# Patient Record
Sex: Female | Born: 1955 | Hispanic: Refuse to answer | Marital: Married | Smoking: Former smoker
Health system: Southern US, Community
[De-identification: ages and names within clinical notes are randomized; demographics above are authoritative.]

## PROBLEM LIST (undated history)

## (undated) DIAGNOSIS — E785 Hyperlipidemia, unspecified: Secondary | ICD-10-CM

## (undated) DIAGNOSIS — I10 Essential (primary) hypertension: Secondary | ICD-10-CM

## (undated) DIAGNOSIS — E119 Type 2 diabetes mellitus without complications: Secondary | ICD-10-CM

## (undated) HISTORY — DX: Hyperlipidemia, unspecified: E78.5

## (undated) HISTORY — DX: Type 2 diabetes mellitus without complications: E11.9

## (undated) HISTORY — PX: CARDIAC CATHETERIZATION: SHX172

## (undated) HISTORY — DX: Essential (primary) hypertension: I10

---

## 2016-03-27 DIAGNOSIS — M19072 Primary osteoarthritis, left ankle and foot: Secondary | ICD-10-CM | POA: Diagnosis not present

## 2016-03-27 DIAGNOSIS — M7732 Calcaneal spur, left foot: Secondary | ICD-10-CM | POA: Diagnosis not present

## 2016-03-27 DIAGNOSIS — M79672 Pain in left foot: Secondary | ICD-10-CM | POA: Diagnosis not present

## 2016-03-29 DIAGNOSIS — Z0000 Encounter for general adult medical examination without abnormal findings: Secondary | ICD-10-CM | POA: Diagnosis not present

## 2016-03-29 DIAGNOSIS — M8580 Other specified disorders of bone density and structure, unspecified site: Secondary | ICD-10-CM | POA: Diagnosis not present

## 2016-03-29 DIAGNOSIS — E785 Hyperlipidemia, unspecified: Secondary | ICD-10-CM | POA: Diagnosis not present

## 2016-03-29 DIAGNOSIS — E1169 Type 2 diabetes mellitus with other specified complication: Secondary | ICD-10-CM | POA: Diagnosis not present

## 2016-03-29 DIAGNOSIS — Z794 Long term (current) use of insulin: Secondary | ICD-10-CM | POA: Diagnosis not present

## 2016-04-03 DIAGNOSIS — E785 Hyperlipidemia, unspecified: Secondary | ICD-10-CM | POA: Diagnosis not present

## 2016-04-03 DIAGNOSIS — Z1239 Encounter for other screening for malignant neoplasm of breast: Secondary | ICD-10-CM | POA: Diagnosis not present

## 2016-04-03 DIAGNOSIS — R748 Abnormal levels of other serum enzymes: Secondary | ICD-10-CM | POA: Diagnosis not present

## 2016-04-03 DIAGNOSIS — M8580 Other specified disorders of bone density and structure, unspecified site: Secondary | ICD-10-CM | POA: Diagnosis not present

## 2016-04-03 DIAGNOSIS — Z0000 Encounter for general adult medical examination without abnormal findings: Secondary | ICD-10-CM | POA: Diagnosis not present

## 2016-09-06 DIAGNOSIS — E1140 Type 2 diabetes mellitus with diabetic neuropathy, unspecified: Secondary | ICD-10-CM | POA: Diagnosis not present

## 2016-09-06 DIAGNOSIS — E781 Pure hyperglyceridemia: Secondary | ICD-10-CM | POA: Diagnosis not present

## 2016-09-06 DIAGNOSIS — Z794 Long term (current) use of insulin: Secondary | ICD-10-CM | POA: Diagnosis not present

## 2016-09-10 DIAGNOSIS — N3001 Acute cystitis with hematuria: Secondary | ICD-10-CM | POA: Diagnosis not present

## 2016-12-06 DIAGNOSIS — M8580 Other specified disorders of bone density and structure, unspecified site: Secondary | ICD-10-CM | POA: Diagnosis not present

## 2016-12-06 DIAGNOSIS — E1140 Type 2 diabetes mellitus with diabetic neuropathy, unspecified: Secondary | ICD-10-CM | POA: Diagnosis not present

## 2016-12-06 DIAGNOSIS — E785 Hyperlipidemia, unspecified: Secondary | ICD-10-CM | POA: Diagnosis not present

## 2016-12-06 DIAGNOSIS — R8290 Unspecified abnormal findings in urine: Secondary | ICD-10-CM | POA: Diagnosis not present

## 2016-12-06 DIAGNOSIS — Z794 Long term (current) use of insulin: Secondary | ICD-10-CM | POA: Diagnosis not present

## 2016-12-06 DIAGNOSIS — E781 Pure hyperglyceridemia: Secondary | ICD-10-CM | POA: Diagnosis not present

## 2016-12-06 DIAGNOSIS — E559 Vitamin D deficiency, unspecified: Secondary | ICD-10-CM | POA: Diagnosis not present

## 2016-12-06 DIAGNOSIS — R300 Dysuria: Secondary | ICD-10-CM | POA: Diagnosis not present

## 2017-03-19 DIAGNOSIS — R1031 Right lower quadrant pain: Secondary | ICD-10-CM | POA: Diagnosis not present

## 2017-04-04 DIAGNOSIS — E1140 Type 2 diabetes mellitus with diabetic neuropathy, unspecified: Secondary | ICD-10-CM | POA: Diagnosis not present

## 2017-04-04 DIAGNOSIS — E785 Hyperlipidemia, unspecified: Secondary | ICD-10-CM | POA: Diagnosis not present

## 2017-04-04 DIAGNOSIS — Z794 Long term (current) use of insulin: Secondary | ICD-10-CM | POA: Diagnosis not present

## 2017-04-04 DIAGNOSIS — I10 Essential (primary) hypertension: Secondary | ICD-10-CM | POA: Diagnosis not present

## 2017-04-14 DIAGNOSIS — E1142 Type 2 diabetes mellitus with diabetic polyneuropathy: Secondary | ICD-10-CM | POA: Diagnosis not present

## 2017-04-14 DIAGNOSIS — Z1231 Encounter for screening mammogram for malignant neoplasm of breast: Secondary | ICD-10-CM | POA: Diagnosis not present

## 2017-04-14 DIAGNOSIS — E113292 Type 2 diabetes mellitus with mild nonproliferative diabetic retinopathy without macular edema, left eye: Secondary | ICD-10-CM | POA: Diagnosis not present

## 2017-04-14 DIAGNOSIS — E1169 Type 2 diabetes mellitus with other specified complication: Secondary | ICD-10-CM | POA: Diagnosis not present

## 2017-04-14 DIAGNOSIS — E1140 Type 2 diabetes mellitus with diabetic neuropathy, unspecified: Secondary | ICD-10-CM | POA: Diagnosis not present

## 2017-04-29 DIAGNOSIS — R1031 Right lower quadrant pain: Secondary | ICD-10-CM | POA: Diagnosis not present

## 2017-05-08 DIAGNOSIS — D3502 Benign neoplasm of left adrenal gland: Secondary | ICD-10-CM | POA: Diagnosis not present

## 2017-05-10 DIAGNOSIS — D3502 Benign neoplasm of left adrenal gland: Secondary | ICD-10-CM | POA: Diagnosis not present

## 2017-05-14 DIAGNOSIS — R1031 Right lower quadrant pain: Secondary | ICD-10-CM | POA: Diagnosis not present

## 2017-05-14 DIAGNOSIS — D259 Leiomyoma of uterus, unspecified: Secondary | ICD-10-CM | POA: Diagnosis not present

## 2017-06-02 DIAGNOSIS — D251 Intramural leiomyoma of uterus: Secondary | ICD-10-CM | POA: Diagnosis not present

## 2017-06-06 DIAGNOSIS — I10 Essential (primary) hypertension: Secondary | ICD-10-CM | POA: Diagnosis not present

## 2017-06-09 DIAGNOSIS — I10 Essential (primary) hypertension: Secondary | ICD-10-CM | POA: Diagnosis not present

## 2017-10-06 DIAGNOSIS — I700 Atherosclerosis of aorta: Secondary | ICD-10-CM | POA: Diagnosis not present

## 2017-10-06 DIAGNOSIS — E1169 Type 2 diabetes mellitus with other specified complication: Secondary | ICD-10-CM | POA: Diagnosis not present

## 2017-10-06 DIAGNOSIS — E785 Hyperlipidemia, unspecified: Secondary | ICD-10-CM | POA: Diagnosis not present

## 2017-10-06 DIAGNOSIS — E559 Vitamin D deficiency, unspecified: Secondary | ICD-10-CM | POA: Diagnosis not present

## 2017-10-06 DIAGNOSIS — I10 Essential (primary) hypertension: Secondary | ICD-10-CM | POA: Diagnosis not present

## 2017-10-06 DIAGNOSIS — E1140 Type 2 diabetes mellitus with diabetic neuropathy, unspecified: Secondary | ICD-10-CM | POA: Diagnosis not present

## 2017-10-06 DIAGNOSIS — E1142 Type 2 diabetes mellitus with diabetic polyneuropathy: Secondary | ICD-10-CM | POA: Diagnosis not present

## 2017-10-06 DIAGNOSIS — Z794 Long term (current) use of insulin: Secondary | ICD-10-CM | POA: Diagnosis not present

## 2017-10-10 DIAGNOSIS — R011 Cardiac murmur, unspecified: Secondary | ICD-10-CM | POA: Diagnosis not present

## 2018-01-05 DIAGNOSIS — R011 Cardiac murmur, unspecified: Secondary | ICD-10-CM | POA: Diagnosis not present

## 2018-01-06 DIAGNOSIS — R002 Palpitations: Secondary | ICD-10-CM | POA: Diagnosis not present

## 2018-03-06 ENCOUNTER — Ambulatory Visit (INDEPENDENT_AMBULATORY_CARE_PROVIDER_SITE_OTHER): Payer: BLUE CROSS/BLUE SHIELD | Admitting: Physician Assistant

## 2018-03-06 ENCOUNTER — Encounter: Payer: Self-pay | Admitting: Physician Assistant

## 2018-03-06 ENCOUNTER — Other Ambulatory Visit: Payer: Self-pay

## 2018-03-06 VITALS — BP 130/68 | HR 96 | Temp 98.2°F | Ht 62.0 in | Wt 167.8 lb

## 2018-03-06 DIAGNOSIS — M273 Alveolitis of jaws: Secondary | ICD-10-CM | POA: Diagnosis not present

## 2018-03-06 MED ORDER — AMOXICILLIN-POT CLAVULANATE 875-125 MG PO TABS
1.0000 | ORAL_TABLET | Freq: Two times a day (BID) | ORAL | 0 refills | Status: AC
Start: 2018-03-06 — End: 2018-03-20

## 2018-03-06 MED ORDER — NAPROXEN 500 MG PO TABS: 500.0000 mg | ORAL_TABLET | Freq: Two times a day (BID) | ORAL | 0 refills | Status: DC

## 2018-03-06 NOTE — Patient Instructions (Addendum)
  Take ten days of the augmentin now. Hold the last 8 pills and start them back within 4 days of going to the dentist for your august appointment.    IF you received an x-ray today, you will receive an invoice from [facility]. Please contact [facility] at [phone] with questions or concerns regarding your invoice.   IF you received labwork today, you will receive an invoice from [facility]. Please contact [facility] at [phone] with questions or concerns regarding your invoice.   Our billing staff will not be able to assist you with questions regarding bills from these companies.  You will be contacted with the lab results as soon as they are available. The fastest way to get your results is to activate your My Chart account. Instructions are located on the last page of this paperwork. If you have not heard from us regarding the results in 2 weeks, please contact this office.

## 2018-03-06 NOTE — Progress Notes (Signed)
    03/09/2018 8:56 AM   DOB: 04-02-1956 / MRN: [id number]  SUBJECTIVE:  Olivia Garza is a 62 y.o. female presenting for tooth pain that started five days ago and is worsening. She has tried several OTC therapies without relief. She has a broken tooth about the area that is paining her and tells me that she has a dental appointment scheduled.   She has no allergies on file.   She  has a past medical history of Diabetes mellitus without complication (HCC).    She  reports that she has quit smoking. She has never used smokeless tobacco. She reports that she drinks about 1.8 oz of alcohol per week. She reports that she does not use drugs. She  has no sexual activity history on file. The patient  has no past surgical history on file.  Her family history includes Cancer in her father and mother; Diabetes in her father; Heart disease in her father.  Review of Systems  Constitutional: Negative for chills and fever.  HENT: Negative for sore throat.   Respiratory: Negative for cough.   Cardiovascular: Negative for chest pain.  Skin: Negative for itching and rash.  Neurological: Negative for dizziness.    The problem list and medications were reviewed and updated by myself where necessary and exist elsewhere in the encounter.   OBJECTIVE:  BP 130/68 (BP Location: Left Arm, Patient Position: Sitting, Cuff Size: Normal)   Pulse 96   Temp 98.2 F (36.8 C) (Oral)   Ht 5\' 2"  (1.575 m)   Wt 167 lb 12.8 oz (76.1 kg)   SpO2 96%   BMI 30.69 kg/m   Wt Readings from Last 3 Encounters:  03/06/18 167 lb 12.8 oz (76.1 kg)   Temp Readings from Last 3 Encounters:  03/06/18 98.2 F (36.8 C) (Oral)   BP Readings from Last 3 Encounters:  03/06/18 130/68   Pulse Readings from Last 3 Encounters:  03/06/18 96    Physical Exam  Constitutional: She is oriented to person, place, and time. She appears well-nourished. No distress.  HENT:  Mouth/Throat:    Eyes: Pupils are equal, round, and  reactive to light. EOM are normal.  Cardiovascular: Normal rate.  Pulmonary/Chest: Effort normal.  Abdominal: She exhibits no distension.  Neurological: She is alert and oriented to person, place, and time. No cranial nerve deficit. Gait normal.  Skin: Skin is dry. She is not diaphoretic.  Psychiatric: She has a normal mood and affect.  Vitals reviewed.     ASSESSMENT AND PLAN:  Olivia Garza was seen today for tooth.  Diagnoses and all orders for this visit:  Infection of tooth socket -     amoxicillin-clavulanate (AUGMENTIN) 875-125 MG tablet; Take 1 tablet by mouth 2 (two) times daily for 14 days. Take with food. -     naproxen (NAPROSYN) 500 MG tablet; Take 1 tablet (500 mg total) by mouth 2 (two) times daily with a meal.    The patient is advised to call or return to clinic if she does not see an improvement in symptoms, or to seek the care of the closest emergency department if she worsens with the above plan.   Deliah BostonMichael Clark, MHS, PA-C Primary Care at [facility] 03/09/2018 8:56 AM

## 2018-03-18 ENCOUNTER — Ambulatory Visit (INDEPENDENT_AMBULATORY_CARE_PROVIDER_SITE_OTHER): Payer: BLUE CROSS/BLUE SHIELD | Admitting: Physician Assistant

## 2018-03-18 ENCOUNTER — Ambulatory Visit: Payer: Self-pay | Admitting: *Deleted

## 2018-03-18 ENCOUNTER — Other Ambulatory Visit: Payer: Self-pay

## 2018-03-18 ENCOUNTER — Encounter: Payer: Self-pay | Admitting: Physician Assistant

## 2018-03-18 VITALS — BP 128/68 | HR 90 | Temp 98.7°F | Resp 16 | Ht 62.0 in | Wt 168.6 lb

## 2018-03-18 DIAGNOSIS — M273 Alveolitis of jaws: Secondary | ICD-10-CM

## 2018-03-18 MED ORDER — TRAMADOL HCL 50 MG PO TABS: 50.0000 mg | ORAL_TABLET | Freq: Three times a day (TID) | ORAL | 0 refills | Status: DC | PRN

## 2018-03-18 MED ORDER — CLINDAMYCIN HCL 300 MG PO CAPS: 300.0000 mg | ORAL_CAPSULE | Freq: Three times a day (TID) | ORAL | 0 refills | Status: DC

## 2018-03-18 NOTE — Progress Notes (Signed)
03/18/2018 2:20 PM   DOB: 12-26-1955 / MRN: [id number]  SUBJECTIVE:  Olivia Garza is a 62 y.o. female presenting for recurrence of tooth pain. Symptoms present for about 1 month now.  The problem was improved with augmentin and naprosyn however the pain returned after cessation of augmentin. She has an appointment with a dentist the middle of this month.  She has No Known Allergies.   She  has a past medical history of Diabetes mellitus without complication (HCC).    She  reports that she has quit smoking. She has never used smokeless tobacco. She reports that she drinks about 1.8 oz of alcohol per week. She reports that she does not use drugs. She  has no sexual activity history on file. The patient  has no past surgical history on file.  Her family history includes Cancer in her father and mother; Diabetes in her father; Heart disease in her father.  Review of Systems  Constitutional: Negative for chills, diaphoresis and fever.  Eyes: Negative.   Respiratory: Negative for cough, hemoptysis, sputum production, shortness of breath and wheezing.   Cardiovascular: Negative for chest pain, orthopnea and leg swelling.  Gastrointestinal: Negative for abdominal pain, blood in stool, constipation, diarrhea, heartburn, melena, nausea and vomiting.  Genitourinary: Negative for dysuria, flank pain, frequency, hematuria and urgency.  Skin: Negative for rash.  Neurological: Negative for dizziness, sensory change, speech change, focal weakness and headaches.    The problem list and medications were reviewed and updated by myself where necessary and exist elsewhere in the encounter.   OBJECTIVE:  BP 128/68   Pulse 90   Temp 98.7 F (37.1 C)   Resp 16   Ht 5\' 2"  (1.575 m)   Wt 168 lb 9.6 oz (76.5 kg)   SpO2 95%   BMI 30.84 kg/m   Wt Readings from Last 3 Encounters:  03/18/18 168 lb 9.6 oz (76.5 kg)  03/06/18 167 lb 12.8 oz (76.1 kg)   Temp Readings from Last 3 Encounters:    03/18/18 98.7 F (37.1 C)  03/06/18 98.2 F (36.8 C) (Oral)   BP Readings from Last 3 Encounters:  03/18/18 128/68  03/06/18 130/68   Pulse Readings from Last 3 Encounters:  03/18/18 90  03/06/18 96    Physical Exam  Constitutional: She is oriented to person, place, and time. She appears well-nourished. No distress.  HENT:  Mouth/Throat:    Eyes: Pupils are equal, round, and reactive to light. EOM are normal.  Cardiovascular: Normal rate, regular rhythm, S1 normal, S2 normal, normal heart sounds and intact distal pulses. Exam reveals no gallop, no friction rub and no decreased pulses.  No murmur heard. Pulmonary/Chest: Effort normal. No stridor. No respiratory distress. She has no wheezes. She has no rales.  Abdominal: She exhibits no distension.  Musculoskeletal: She exhibits no edema.  Neurological: She is alert and oriented to person, place, and time. No cranial nerve deficit. Gait normal.  Skin: Skin is dry. She is not diaphoretic.  Psychiatric: She has a normal mood and affect.  Vitals reviewed.   ASSESSMENT AND PLAN:  Olivia Garza was seen today for dental pain.  Diagnoses and all orders for this visit:  Infection of tooth socket -     clindamycin (CLEOCIN) 300 MG capsule; Take 1 capsule (300 mg total) by mouth 3 (three) times daily. -     traMADol (ULTRAM) 50 MG tablet; Take 1 tablet (50 mg total) by mouth every 8 (eight) hours as needed.  The patient is advised to call or return to clinic if she does not see an improvement in symptoms, or to seek the care of the closest emergency department if she worsens with the above plan.   Olivia Garza, MHS, PA-C Primary Care at [facility] 03/18/2018 2:20 PM

## 2018-03-18 NOTE — Telephone Encounter (Signed)
Pt called with having dental pain from a cracked tooth that happen during the memorial holiday. She stated no pain at first and then a lot of pain. Saw provider in the office on 03/06/18 and was prescribed some pain med and antibiotic. Finished up the antibiotic on Sunday. Pain got better until last night. Took pain med, used ice to the area, no relief.  No fever or swelling that she can see. Her dentist appointment is scheduled for 03/31/18. Appointment scheduled for today. Will route to [facility] at [facility].  Reason for Disposition . [1] SEVERE pain (e.g., excruciating, unable to do any normal activities) AND [2] not improved 2 hours after pain medicine  Answer Assessment - Initial Assessment Questions 1. LOCATION: "Which tooth is hurting?"  (e.g., right-side/left-side, upper/lower, front/back)     Left 2nd from the front tooth 2. ONSET: "When did the toothache start?"  (e.g., hours, days)      Tooth cracked on Memorial day 3. SEVERITY: "How bad is the toothache?"  (Scale 1-10; mild, moderate or severe)   - MILD (1-3): doesn't interfere with chewing    - MODERATE (4-7): interferes with chewing, interferes with normal activities, awakens from sleep     - SEVERE (8-10): unable to eat, unable to do any normal activities, excruciating pain        Pain # 8 4. SWELLING: "Is there any visible swelling of your face?"     no 5. OTHER SYMPTOMS: "Do you have any other symptoms?" (e.g., fever)     no 6. PREGNANCY: "Is there any chance you are pregnant?" "When was your last menstrual period?"     n/a  Protocols used: TOOTHACHE-A-AH

## 2018-03-18 NOTE — Patient Instructions (Addendum)
Lets try a different antibiotic and some tramadol.     IF you received an x-ray today, you will receive an invoice from [facility]. Please contact [facility] at [phone] with questions or concerns regarding your invoice.   IF you received labwork today, you will receive an invoice from [facility]. Please contact [facility] at [phone] with questions or concerns regarding your invoice.   Our billing staff will not be able to assist you with questions regarding bills from these companies.  You will be contacted with the lab results as soon as they are available. The fastest way to get your results is to activate your My Chart account. Instructions are located on the last page of this paperwork. If you have not heard from us regarding the results in 2 weeks, please contact this office.

## 2018-05-27 DIAGNOSIS — E1140 Type 2 diabetes mellitus with diabetic neuropathy, unspecified: Secondary | ICD-10-CM | POA: Diagnosis not present

## 2018-05-27 DIAGNOSIS — E1142 Type 2 diabetes mellitus with diabetic polyneuropathy: Secondary | ICD-10-CM | POA: Diagnosis not present

## 2018-05-27 DIAGNOSIS — D3502 Benign neoplasm of left adrenal gland: Secondary | ICD-10-CM | POA: Diagnosis not present

## 2018-05-27 DIAGNOSIS — I10 Essential (primary) hypertension: Secondary | ICD-10-CM | POA: Diagnosis not present

## 2018-08-28 ENCOUNTER — Emergency Department (HOSPITAL_COMMUNITY): Payer: BLUE CROSS/BLUE SHIELD

## 2018-08-28 ENCOUNTER — Other Ambulatory Visit: Payer: Self-pay

## 2018-08-28 ENCOUNTER — Emergency Department (HOSPITAL_COMMUNITY)
Admission: EM | Admit: 2018-08-28 | Discharge: 2018-08-28 | Disposition: A | Discharge status: Home / Self Care | Payer: BLUE CROSS/BLUE SHIELD | Attending: Emergency Medicine | Admitting: Emergency Medicine

## 2018-08-28 ENCOUNTER — Encounter (HOSPITAL_COMMUNITY): Payer: Self-pay | Admitting: *Deleted

## 2018-08-28 DIAGNOSIS — Z87891 Personal history of nicotine dependence: Secondary | ICD-10-CM | POA: Diagnosis not present

## 2018-08-28 DIAGNOSIS — N3000 Acute cystitis without hematuria: Secondary | ICD-10-CM | POA: Insufficient documentation

## 2018-08-28 DIAGNOSIS — Z794 Long term (current) use of insulin: Secondary | ICD-10-CM | POA: Diagnosis not present

## 2018-08-28 DIAGNOSIS — R1031 Right lower quadrant pain: Secondary | ICD-10-CM | POA: Diagnosis not present

## 2018-08-28 DIAGNOSIS — E119 Type 2 diabetes mellitus without complications: Secondary | ICD-10-CM | POA: Insufficient documentation

## 2018-08-28 DIAGNOSIS — K760 Fatty (change of) liver, not elsewhere classified: Secondary | ICD-10-CM | POA: Diagnosis not present

## 2018-08-28 DIAGNOSIS — R1084 Generalized abdominal pain: Secondary | ICD-10-CM | POA: Diagnosis present

## 2018-08-28 DIAGNOSIS — Z79899 Other long term (current) drug therapy: Secondary | ICD-10-CM | POA: Diagnosis not present

## 2018-08-28 DIAGNOSIS — R300 Dysuria: Secondary | ICD-10-CM | POA: Diagnosis not present

## 2018-08-28 LAB — URINALYSIS, ROUTINE W REFLEX MICROSCOPIC
BILIRUBIN URINE: NEGATIVE
Glucose, UA: >=500 mg/dL — AB
Ketones, ur: NEGATIVE mg/dL
NITRITE: NEGATIVE
Protein, ur: NEGATIVE mg/dL
Specific Gravity, Urine: 1.031 — ABNORMAL HIGH (ref 1.005–1.030)
pH: 5 (ref 5.0–8.0)

## 2018-08-28 MED ORDER — CEPHALEXIN 500 MG PO CAPS: 500.0000 mg | ORAL_CAPSULE | Freq: Four times a day (QID) | ORAL | 0 refills | Status: AC

## 2018-08-28 MED ORDER — CEPHALEXIN 250 MG PO CAPS
500.0000 mg | ORAL_CAPSULE | Freq: Once | ORAL | Status: AC
  Administered 2018-08-28: 500 mg via ORAL
  Filled 2018-08-28: qty 2

## 2018-08-28 MED ORDER — HYDROCODONE-ACETAMINOPHEN 5-325 MG PO TABS
1.0000 | ORAL_TABLET | Freq: Once | ORAL | Status: AC
  Administered 2018-08-28: 1 via ORAL
  Filled 2018-08-28: qty 1

## 2018-08-28 NOTE — ED Triage Notes (Signed)
Lower abd pain for one week with urinary aymptoms  No temp

## 2018-08-28 NOTE — Discharge Instructions (Addendum)
Take antibiotics prescribed. Take the entire course, even if your symptoms improve. Your urine was sent for culture/to grow. If this antibiotic does not work, you will get a phone call and another antibiotic will be called in. Use Tylenol and ibuprofen as needed for pain.  Use heating pads to help with pain control. Make sure you stay well-hydrated with water. Follow-up with a primary care doctor if your symptoms are ongoing. Return to the emergency room with renal cardiovascular persistent vomiting, severe worsening pain, inability to urinate, or any new, worsening, concerning symptoms.

## 2018-08-28 NOTE — ED Notes (Signed)
Pt stable, ambulatory, states understanding of discharge instructions 

## 2018-08-28 NOTE — ED Notes (Signed)
Patient transported to CT 

## 2018-08-28 NOTE — ED Provider Notes (Signed)
[facility] EMERGENCY DEPARTMENT Provider Note   CSN: [id number] Arrival date & time: 08/28/18  1515     History   Chief Complaint Chief Complaint  Patient presents with  . Abdominal Pain    HPI Olivia Garza is a 63 y.o. female presenting for evaluation of abdominal pain.  Patient states the past week, she has been having abnormal urinary odor.  However, she denies dysuria.  She thought she was developing a UTI, and last night she developed severe right-sided flank and lower quadrant abdominal pain.  Pain is been constant since.  She took Tylenol and ibuprofen without improvement of symptoms.  She denies fevers, chills, chest pain, shortness breath, nausea, vomiting, pain on the left side, or abnormal bowel movements.  Patient denies current dysuria or hematuria.  She denies history of kidney stones.  Reports a history of diabetes which is poorly controlled.  She denies history of abdominal surgeries.  States symptoms worsen with movement and palpation.  Nothing makes it better.  No change with urination or bowel movements.  Has been tolerating p.o. without difficulty.  HPI  Past Medical History:  Diagnosis Date  . Diabetes mellitus without complication (HCC)     There are no active problems to display for this patient.   History reviewed. No pertinent surgical history.   OB History   No obstetric history on file.      Home Medications    Prior to Admission medications   Medication Sig Start Date End Date Taking? Authorizing Provider  cephALEXin (KEFLEX) 500 MG capsule Take 1 capsule (500 mg total) by mouth 4 (four) times daily for 5 days. 08/28/18 09/02/18  Laiken Nohr, PA-C  clindamycin (CLEOCIN) 300 MG capsule Take 1 capsule (300 mg total) by mouth 3 (three) times daily. 03/18/18   Ofilia Neaslark, Michael L, PA-C  Dulaglutide (TRULICITY [place]) Inject into the skin.    [provider]  FENOFIBRATE PO Take by mouth.    [provider]    Insulin Glargine (LANTUS [place]) Inject into the skin.    [provider]  metformin (FORTAMET) 1000 MG (OSM) 24 hr tablet Take 1,000 mg by mouth daily with breakfast.    [provider]  naproxen (NAPROSYN) 500 MG tablet Take 1 tablet (500 mg total) by mouth 2 (two) times daily with a meal. 03/06/18   Ofilia Neaslark, Michael L, PA-C  traMADol (ULTRAM) 50 MG tablet Take 1 tablet (50 mg total) by mouth every 8 (eight) hours as needed. 03/18/18   Ofilia Neaslark, Michael L, PA-C    Family History Family History  Problem Relation Age of Onset  . Cancer Mother        Lung  . Cancer Father        Lung and Colon  . Diabetes Father   . Heart disease Father     Social History Social History   Tobacco Use  . Smoking status: Former Games developermoker  . Smokeless tobacco: Never Used  Substance Use Topics  . Alcohol use: Yes    Alcohol/week: 3.0 standard drinks    Types: 3 Standard drinks or equivalent per week  . Drug use: Never     Allergies   Patient has no known allergies.   Review of Systems Review of Systems  Gastrointestinal: Positive for abdominal pain.  Genitourinary:       Abnormal urinary odor  All other systems reviewed and are negative.    Physical Exam Updated Vital Signs BP 115/62 (BP Location: Right Arm)  Pulse 90   Temp 98.2 F (36.8 C) (Oral)   Resp 20   Ht 5\' 2"  (1.575 m)   Wt 77.1 kg   SpO2 99%   BMI 31.09 kg/m   Physical Exam Vitals signs and nursing note reviewed.  Constitutional:      General: She is not in acute distress.    Appearance: She is well-developed.     Comments: Sitting in the chair no acute distress  HENT:     Head: Normocephalic and atraumatic.  Eyes:     Conjunctiva/sclera: Conjunctivae normal.     Pupils: Pupils are equal, round, and reactive to light.  Neck:     Musculoskeletal: Normal range of motion and neck supple.  Cardiovascular:     Rate and Rhythm: Normal rate and regular rhythm.  Pulmonary:     Effort: Pulmonary effort is  normal. No respiratory distress.     Breath sounds: Normal breath sounds. No wheezing.  Abdominal:     General: Bowel sounds are normal. There is no distension.     Palpations: Abdomen is soft.     Tenderness: There is abdominal tenderness in the right lower quadrant and suprapubic area.     Comments: TTP of RLQ abd and suprapubic abd. No rebound. No ttp of R CVA. No rigidity, guarding, or distention.   Musculoskeletal: Normal range of motion.  Skin:    General: Skin is warm and dry.     Capillary Refill: Capillary refill takes less than 2 seconds.  Neurological:     Mental Status: She is alert and oriented to person, place, and time.      ED Treatments / Results  Labs (all labs ordered are listed, but only abnormal results are displayed) Labs Reviewed  URINALYSIS, ROUTINE W REFLEX MICROSCOPIC - Abnormal; Notable for the following components:      Result Value   APPearance HAZY (*)    Specific Gravity, Urine 1.031 (*)    Glucose, UA >=500 (*)    Hgb urine dipstick MODERATE (*)    Leukocytes, UA SMALL (*)    Bacteria, UA MANY (*)    All other components within normal limits  URINE CULTURE    EKG None  Radiology Ct Renal Stone Study  Result Date: 08/28/2018 CLINICAL DATA:  63 year old female with acute RIGHT flank and abdominal pain and dysuria. EXAM: CT ABDOMEN AND PELVIS WITHOUT CONTRAST TECHNIQUE: Multidetector CT imaging of the abdomen and pelvis was performed following the standard protocol without IV contrast. COMPARISON:  None. FINDINGS: Please note that parenchymal abnormalities may be missed without intravenous contrast. Lower chest: No acute abnormality. Hepatobiliary: Hepatic steatosis noted. The gallbladder is unremarkable. No biliary dilatation. Pancreas: Unremarkable Spleen: Unremarkable Adrenals/Urinary Tract: The kidneys, adrenal glands and bladder are unremarkable except for a 1.5 cm LEFT adrenal adenoma. No evidence of hydronephrosis or urinary calculi.  Stomach/Bowel: Stomach is within normal limits. Appendix appears normal. No evidence of bowel wall thickening, distention, or inflammatory changes. Vascular/Lymphatic: Aortic atherosclerosis. No enlarged abdominal or pelvic lymph nodes. Reproductive: Uterus and bilateral adnexa are unremarkable. Other: No ascites, focal collection or pneumoperitoneum. Musculoskeletal: No acute or suspicious bony abnormalities. IMPRESSION: 1. No evidence of acute abnormality. No CT findings to suggest a cause for this patient's abdominal pain. 2. Hepatic steatosis. 3.  Aortic Atherosclerosis (ICD10-I70.0). Electronically Signed   By: Harmon Pier M.D.   On: 08/28/2018 17:23    Procedures Procedures (including critical care time)  Medications Ordered in ED Medications  HYDROcodone-acetaminophen (NORCO/VICODIN) 5-325  MG per tablet 1 tablet (1 tablet Oral Given 08/28/18 1608)  cephALEXin (KEFLEX) capsule 500 mg (500 mg Oral Given 08/28/18 1756)     Initial Impression / Assessment and Plan / ED Course  I have reviewed the triage vital signs and the nursing notes.  Pertinent labs & imaging results that were available during my care of the patient were reviewed by me and considered in my medical decision making (see chart for details).     Pt presenting for evaluation of urinary symptoms and right lower quadrant abdominal pain.  Physical exam shows patient is afebrile not tachycardic and appears nontoxic.  No nausea or vomiting.  No fevers or chills.  As such, lower suspicion for appendicitis.  However, considering that she is having right lower quadrant abdominal pain, will obtain imaging.  Additionally, having urinary symptoms with acute onset pain, consider kidney stone.  Also consider GI viral cause, although lower suspicion considering lack of vomiting or diarrhea.  Will obtain urine and imaging and reassess.  Urine with leuks, many bacteria, and blood.  Will treat for UTI.  CT imaging pending.  CT without signs  of kidney stone.  No appendicitis or other concerning infections at this time.  No obvious Pyelo.  As such, pain is likely related to UTI.  Adnexa unremarkable, patient postmenopausal.  Low suspicion for torsion, TOA, PID at this time, as patient is without discharge.  At this time, patient appears safe for discharge.  Return precautions given.  Patient states she understands agrees plan.  Final Clinical Impressions(s) / ED Diagnoses   Final diagnoses:  Acute cystitis without hematuria    ED Discharge Orders         Ordered    cephALEXin (KEFLEX) 500 MG capsule  4 times daily     08/28/18 1744           Alveria Apley, PA-C 08/28/18 2215    Sabas Sous, MD 08/29/18 1252

## 2018-08-30 LAB — URINE CULTURE: Culture: >=100000 — AB

## 2018-08-31 ENCOUNTER — Telehealth: Payer: Self-pay | Admitting: Emergency Medicine

## 2018-08-31 NOTE — Telephone Encounter (Signed)
Post ED Visit - Positive Culture Follow-up  Culture report reviewed by antimicrobial stewardship pharmacist:  []  Enzo Bi, Pharm.D. []  Celedonio Miyamoto, Pharm.D., BCPS AQ-ID []  Garvin Fila, Pharm.D., BCPS []  Georgina Pillion, [address].D., BCPS []  [place]ondo, [address].D., BCPS, AAHIVP []  Estella Husk, Pharm.D., BCPS, AAHIVP []  Lysle Pearl, PharmD, BCPS []  Phillips Climes, PharmD, BCPS []  Agapito Games, PharmD, BCPS []  Verlan Friends, PharmD Ewing Schlein PharmD  Positive urine culture Treated with cephalexin, organism sensitive to the same and no further patient follow-up is required at this time.  Berle Mull 08/31/2018, 8:55 AM

## 2018-09-03 DIAGNOSIS — M5442 Lumbago with sciatica, left side: Secondary | ICD-10-CM | POA: Diagnosis not present

## 2018-09-03 DIAGNOSIS — Z794 Long term (current) use of insulin: Secondary | ICD-10-CM | POA: Diagnosis not present

## 2018-09-03 DIAGNOSIS — E1140 Type 2 diabetes mellitus with diabetic neuropathy, unspecified: Secondary | ICD-10-CM | POA: Diagnosis not present

## 2018-09-15 DIAGNOSIS — M5442 Lumbago with sciatica, left side: Secondary | ICD-10-CM | POA: Diagnosis not present

## 2018-11-11 DIAGNOSIS — Z794 Long term (current) use of insulin: Secondary | ICD-10-CM | POA: Diagnosis not present

## 2018-11-11 DIAGNOSIS — I10 Essential (primary) hypertension: Secondary | ICD-10-CM | POA: Diagnosis not present

## 2018-11-11 DIAGNOSIS — D3502 Benign neoplasm of left adrenal gland: Secondary | ICD-10-CM | POA: Diagnosis not present

## 2018-11-11 DIAGNOSIS — E1140 Type 2 diabetes mellitus with diabetic neuropathy, unspecified: Secondary | ICD-10-CM | POA: Diagnosis not present

## 2019-02-13 DIAGNOSIS — D3502 Benign neoplasm of left adrenal gland: Secondary | ICD-10-CM | POA: Diagnosis not present

## 2019-02-13 DIAGNOSIS — E1140 Type 2 diabetes mellitus with diabetic neuropathy, unspecified: Secondary | ICD-10-CM | POA: Diagnosis not present

## 2019-02-13 DIAGNOSIS — Z794 Long term (current) use of insulin: Secondary | ICD-10-CM | POA: Diagnosis not present

## 2019-02-16 DIAGNOSIS — E1140 Type 2 diabetes mellitus with diabetic neuropathy, unspecified: Secondary | ICD-10-CM | POA: Diagnosis not present

## 2019-02-16 DIAGNOSIS — I10 Essential (primary) hypertension: Secondary | ICD-10-CM | POA: Diagnosis not present

## 2019-02-16 DIAGNOSIS — D3502 Benign neoplasm of left adrenal gland: Secondary | ICD-10-CM | POA: Diagnosis not present

## 2019-02-16 DIAGNOSIS — Z794 Long term (current) use of insulin: Secondary | ICD-10-CM | POA: Diagnosis not present

## 2019-05-20 DIAGNOSIS — I10 Essential (primary) hypertension: Secondary | ICD-10-CM | POA: Diagnosis not present

## 2019-05-20 DIAGNOSIS — D3502 Benign neoplasm of left adrenal gland: Secondary | ICD-10-CM | POA: Diagnosis not present

## 2019-05-20 DIAGNOSIS — E1140 Type 2 diabetes mellitus with diabetic neuropathy, unspecified: Secondary | ICD-10-CM | POA: Diagnosis not present

## 2019-05-20 DIAGNOSIS — Z794 Long term (current) use of insulin: Secondary | ICD-10-CM | POA: Diagnosis not present

## 2019-08-11 DIAGNOSIS — D3502 Benign neoplasm of left adrenal gland: Secondary | ICD-10-CM | POA: Diagnosis not present

## 2019-08-11 DIAGNOSIS — I10 Essential (primary) hypertension: Secondary | ICD-10-CM | POA: Diagnosis not present

## 2019-08-11 DIAGNOSIS — Z794 Long term (current) use of insulin: Secondary | ICD-10-CM | POA: Diagnosis not present

## 2019-08-11 DIAGNOSIS — E1140 Type 2 diabetes mellitus with diabetic neuropathy, unspecified: Secondary | ICD-10-CM | POA: Diagnosis not present

## 2019-08-26 DIAGNOSIS — Z20822 Contact with and (suspected) exposure to covid-19: Secondary | ICD-10-CM | POA: Diagnosis not present

## 2019-12-23 DIAGNOSIS — D3502 Benign neoplasm of left adrenal gland: Secondary | ICD-10-CM | POA: Diagnosis not present

## 2019-12-23 DIAGNOSIS — E1140 Type 2 diabetes mellitus with diabetic neuropathy, unspecified: Secondary | ICD-10-CM | POA: Diagnosis not present

## 2019-12-23 DIAGNOSIS — I10 Essential (primary) hypertension: Secondary | ICD-10-CM | POA: Diagnosis not present

## 2019-12-23 DIAGNOSIS — Z794 Long term (current) use of insulin: Secondary | ICD-10-CM | POA: Diagnosis not present

## 2020-04-12 DIAGNOSIS — Z794 Long term (current) use of insulin: Secondary | ICD-10-CM | POA: Diagnosis not present

## 2020-04-12 DIAGNOSIS — E1140 Type 2 diabetes mellitus with diabetic neuropathy, unspecified: Secondary | ICD-10-CM | POA: Diagnosis not present

## 2020-04-12 DIAGNOSIS — D3502 Benign neoplasm of left adrenal gland: Secondary | ICD-10-CM | POA: Diagnosis not present

## 2020-04-12 DIAGNOSIS — I10 Essential (primary) hypertension: Secondary | ICD-10-CM | POA: Diagnosis not present

## 2020-04-12 DIAGNOSIS — E11319 Type 2 diabetes mellitus with unspecified diabetic retinopathy without macular edema: Secondary | ICD-10-CM | POA: Diagnosis not present

## 2020-05-15 DIAGNOSIS — S30811A Abrasion of abdominal wall, initial encounter: Secondary | ICD-10-CM | POA: Diagnosis not present

## 2020-05-15 DIAGNOSIS — S20211A Contusion of right front wall of thorax, initial encounter: Secondary | ICD-10-CM | POA: Diagnosis not present

## 2020-05-19 DIAGNOSIS — E559 Vitamin D deficiency, unspecified: Secondary | ICD-10-CM | POA: Diagnosis not present

## 2020-05-19 DIAGNOSIS — Z794 Long term (current) use of insulin: Secondary | ICD-10-CM | POA: Diagnosis not present

## 2020-05-19 DIAGNOSIS — I10 Essential (primary) hypertension: Secondary | ICD-10-CM | POA: Diagnosis not present

## 2020-05-19 DIAGNOSIS — R911 Solitary pulmonary nodule: Secondary | ICD-10-CM | POA: Diagnosis not present

## 2020-05-19 DIAGNOSIS — R0781 Pleurodynia: Secondary | ICD-10-CM | POA: Diagnosis not present

## 2020-05-19 DIAGNOSIS — R8290 Unspecified abnormal findings in urine: Secondary | ICD-10-CM | POA: Diagnosis not present

## 2020-05-19 DIAGNOSIS — E785 Hyperlipidemia, unspecified: Secondary | ICD-10-CM | POA: Diagnosis not present

## 2020-05-19 DIAGNOSIS — E1140 Type 2 diabetes mellitus with diabetic neuropathy, unspecified: Secondary | ICD-10-CM | POA: Diagnosis not present

## 2020-05-19 DIAGNOSIS — R350 Frequency of micturition: Secondary | ICD-10-CM | POA: Diagnosis not present

## 2020-05-26 DIAGNOSIS — R918 Other nonspecific abnormal finding of lung field: Secondary | ICD-10-CM | POA: Diagnosis not present

## 2020-05-26 DIAGNOSIS — K760 Fatty (change of) liver, not elsewhere classified: Secondary | ICD-10-CM | POA: Diagnosis not present

## 2020-05-26 DIAGNOSIS — I2510 Atherosclerotic heart disease of native coronary artery without angina pectoris: Secondary | ICD-10-CM | POA: Diagnosis not present

## 2020-05-26 DIAGNOSIS — I700 Atherosclerosis of aorta: Secondary | ICD-10-CM | POA: Diagnosis not present

## 2020-05-26 DIAGNOSIS — S2241XA Multiple fractures of ribs, right side, initial encounter for closed fracture: Secondary | ICD-10-CM | POA: Diagnosis not present

## 2020-06-02 DIAGNOSIS — I10 Essential (primary) hypertension: Secondary | ICD-10-CM | POA: Diagnosis not present

## 2020-06-02 DIAGNOSIS — I700 Atherosclerosis of aorta: Secondary | ICD-10-CM | POA: Diagnosis not present

## 2020-06-02 DIAGNOSIS — E1169 Type 2 diabetes mellitus with other specified complication: Secondary | ICD-10-CM | POA: Diagnosis not present

## 2020-06-02 DIAGNOSIS — E1142 Type 2 diabetes mellitus with diabetic polyneuropathy: Secondary | ICD-10-CM | POA: Diagnosis not present

## 2020-11-02 ENCOUNTER — Encounter (HOSPITAL_BASED_OUTPATIENT_CLINIC_OR_DEPARTMENT_OTHER): Payer: Self-pay | Admitting: *Deleted

## 2020-11-02 ENCOUNTER — Other Ambulatory Visit: Payer: Self-pay

## 2020-11-02 ENCOUNTER — Emergency Department (HOSPITAL_BASED_OUTPATIENT_CLINIC_OR_DEPARTMENT_OTHER): Payer: Medicare HMO

## 2020-11-02 ENCOUNTER — Emergency Department (HOSPITAL_BASED_OUTPATIENT_CLINIC_OR_DEPARTMENT_OTHER)
Admission: EM | Admit: 2020-11-02 | Discharge: 2020-11-02 | Disposition: A | Discharge status: Home / Self Care | Payer: Medicare HMO | Attending: Emergency Medicine | Admitting: Emergency Medicine

## 2020-11-02 DIAGNOSIS — Z87891 Personal history of nicotine dependence: Secondary | ICD-10-CM | POA: Diagnosis not present

## 2020-11-02 DIAGNOSIS — Z794 Long term (current) use of insulin: Secondary | ICD-10-CM | POA: Insufficient documentation

## 2020-11-02 DIAGNOSIS — R1031 Right lower quadrant pain: Secondary | ICD-10-CM | POA: Diagnosis not present

## 2020-11-02 DIAGNOSIS — Z7984 Long term (current) use of oral hypoglycemic drugs: Secondary | ICD-10-CM | POA: Diagnosis not present

## 2020-11-02 DIAGNOSIS — E119 Type 2 diabetes mellitus without complications: Secondary | ICD-10-CM | POA: Insufficient documentation

## 2020-11-02 LAB — COMPREHENSIVE METABOLIC PANEL
ALT: 20 U/L (ref 0–44)
AST: 20 U/L (ref 15–41)
Albumin: 4.5 g/dL (ref 3.5–5.0)
Alkaline Phosphatase: 93 U/L (ref 38–126)
Anion gap: 12 (ref 5–15)
BUN: 17 mg/dL (ref 8–23)
CO2: 22 mmol/L (ref 22–32)
Calcium: 9.1 mg/dL (ref 8.9–10.3)
Chloride: 104 mmol/L (ref 98–111)
Creatinine, Ser: 0.71 mg/dL (ref 0.44–1.00)
GFR, Estimated: >60 mL/min (ref >60)
Glucose, Bld: 165 mg/dL — ABNORMAL HIGH (ref 70–99)
Potassium: 3.8 mmol/L (ref 3.5–5.1)
Sodium: 138 mmol/L (ref 135–145)
Total Bilirubin: 0.2 mg/dL — ABNORMAL LOW (ref 0.3–1.2)
Total Protein: 7.5 g/dL (ref 6.5–8.1)

## 2020-11-02 LAB — CBC
HCT: 42.8 % (ref 36.0–46.0)
Hemoglobin: 14.3 g/dL (ref 12.0–15.0)
MCH: 28.5 pg (ref 26.0–34.0)
MCHC: 33.4 g/dL (ref 30.0–36.0)
MCV: 85.3 fL (ref 80.0–100.0)
Platelets: 305 10*3/uL (ref 150–400)
RBC: 5.02 MIL/uL (ref 3.87–5.11)
RDW: 13.2 % (ref 11.5–15.5)
WBC: 9.4 10*3/uL (ref 4.0–10.5)
nRBC: 0 % (ref 0.0–0.2)

## 2020-11-02 LAB — URINALYSIS, ROUTINE W REFLEX MICROSCOPIC
Bilirubin Urine: NEGATIVE
Glucose, UA: >=500 mg/dL — AB
Ketones, ur: NEGATIVE mg/dL
Leukocytes,Ua: NEGATIVE
Nitrite: NEGATIVE
Protein, ur: NEGATIVE mg/dL
Specific Gravity, Urine: 1.02 (ref 1.005–1.030)
pH: 5.5 (ref 5.0–8.0)

## 2020-11-02 LAB — LIPASE, BLOOD: Lipase: 42 U/L (ref 11–51)

## 2020-11-02 LAB — URINALYSIS, MICROSCOPIC (REFLEX)

## 2020-11-02 MED ORDER — IOHEXOL 300 MG/ML  SOLN
100.0000 mL | Freq: Once | INTRAMUSCULAR | Status: AC | PRN
  Administered 2020-11-02: 100 mL via INTRAVENOUS

## 2020-11-02 NOTE — ED Provider Notes (Signed)
[facility] EMERGENCY DEPARTMENT Provider Note   CSN: [id number] Arrival date & time: 11/02/20  1349     History Chief Complaint  Patient presents with  . Abdominal Pain    Olivia Garza is a 65 y.o. female.  HPI Patient is a 65 year old female with a history of diabetes mellitus who presents the emergency department due to 1 week of right lower quadrant pain.  Patient reports a history of similar symptoms in the past and had a reassuring CT scan at that time.  She states her pain began once again about 1 week ago.  She has no other complaints.  No fevers, chills, chest pain, shortness of breath, nausea, vomiting, diarrhea, dysuria, hematuria, vaginal bleeding, vaginal discharge.  She states that she was evaluated by her PCP earlier today and told to come to the emergency department for CT scan.  Her current pain is 5/10.  She states it has been constant.  Alleviates mildly when lying still or taking ibuprofen.  Worsens with bending forward.    Past Medical History:  Diagnosis Date  . Diabetes mellitus without complication (HCC)     There are no problems to display for this patient.   History reviewed. No pertinent surgical history.   OB History   No obstetric history on file.     Family History  Problem Relation Age of Onset  . Cancer Mother        Lung  . Cancer Father        Lung and Colon  . Diabetes Father   . Heart disease Father     Social History   Tobacco Use  . Smoking status: Former Games developer  . Smokeless tobacco: Never Used  Substance Use Topics  . Alcohol use: Yes    Alcohol/week: 3.0 standard drinks    Types: 3 Standard drinks or equivalent per week  . Drug use: Never    Home Medications Prior to Admission medications   Medication Sig Start Date End Date Taking? Authorizing Provider  empagliflozin (JARDIANCE) 25 MG TABS tablet Take by mouth. 04/12/20  Yes [provider]  Insulin Glargine (LANTUS [place]) Inject into the skin.    Yes [provider]  losartan (COZAAR) 25 MG tablet Take 1 tablet by mouth daily. 06/02/20  Yes [provider]  metformin (FORTAMET) 1000 MG (OSM) 24 hr tablet Take 1,000 mg by mouth daily with breakfast.   Yes [provider]  rosuvastatin (CRESTOR) 20 MG tablet Take 1 tablet by mouth daily. 06/02/20  Yes [provider]  clindamycin (CLEOCIN) 300 MG capsule Take 1 capsule (300 mg total) by mouth 3 (three) times daily. 03/18/18   Ofilia Neas, PA-C  Dulaglutide (TRULICITY [place]) Inject into the skin.    [provider]  FENOFIBRATE PO Take by mouth.    [provider]  naproxen (NAPROSYN) 500 MG tablet Take 1 tablet (500 mg total) by mouth 2 (two) times daily with a meal. 03/06/18   Ofilia Neas, PA-C  traMADol (ULTRAM) 50 MG tablet Take 1 tablet (50 mg total) by mouth every 8 (eight) hours as needed. 03/18/18   Ofilia Neas, PA-C    Allergies    Patient has no known allergies.  Review of Systems   Review of Systems  All other systems reviewed and are negative. Ten systems reviewed and are negative for acute change, except as noted in the HPI.   Physical Exam Updated Vital Signs BP 126/65 (BP Location: Left Arm)  Pulse 74   Temp 98.1 F (36.7 C) (Oral)   Resp 20   Ht 5\' 2"  (1.575 m)   Wt 77.2 kg   SpO2 98%   BMI 31.13 kg/m   Physical Exam Vitals and nursing note reviewed.  Constitutional:      General: She is not in acute distress.    Appearance: Normal appearance. She is well-developed. She is not ill-appearing, toxic-appearing or diaphoretic.  HENT:     Head: Normocephalic and atraumatic.     Right Ear: External ear normal.     Left Ear: External ear normal.     Nose: Nose normal.     Mouth/Throat:     Mouth: Mucous membranes are moist.     Pharynx: Oropharynx is clear. No oropharyngeal exudate or posterior oropharyngeal erythema.  Eyes:     Extraocular Movements: Extraocular movements intact.   Cardiovascular:     Rate and Rhythm: Normal rate and regular rhythm.     Pulses: Normal pulses.     Heart sounds: Normal heart sounds. No murmur heard. No friction rub. No gallop.   Pulmonary:     Effort: Pulmonary effort is normal. No respiratory distress.     Breath sounds: Normal breath sounds. No stridor. No wheezing, rhonchi or rales.  Abdominal:     General: Abdomen is flat and protuberant. Bowel sounds are normal.     Palpations: Abdomen is soft.     Tenderness: There is abdominal tenderness in the right lower quadrant. There is no right CVA tenderness or left CVA tenderness. Positive signs include McBurney's sign. Negative signs include Murphy's sign and Rovsing's sign.     Comments: Protuberant abdomen that is soft.  Mild tenderness noted along the right lower quadrant.  No suprapubic pain.  Negative Rovsing sign.  No CVA tenderness.  Musculoskeletal:        General: Normal range of motion.     Cervical back: Normal range of motion and neck supple. No tenderness.     Comments: Additional mild tenderness along the right lumbar spinal musculature.  Skin:    General: Skin is warm and dry.  Neurological:     General: No focal deficit present.     Mental Status: She is alert and oriented to person, place, and time.  Psychiatric:        Mood and Affect: Mood normal.        Behavior: Behavior normal.    ED Results / Procedures / Treatments   Labs (all labs ordered are listed, but only abnormal results are displayed) Labs Reviewed  COMPREHENSIVE METABOLIC PANEL - Abnormal; Notable for the following components:      Result Value   Glucose, Bld 165 (*)    Total Bilirubin 0.2 (*)    All other components within normal limits  URINALYSIS, ROUTINE W REFLEX MICROSCOPIC - Abnormal; Notable for the following components:   Glucose, UA >=500 (*)    Hgb urine dipstick TRACE (*)    All other components within normal limits  URINALYSIS, MICROSCOPIC (REFLEX) - Abnormal; Notable for the  following components:   Bacteria, UA FEW (*)    All other components within normal limits  LIPASE, BLOOD  CBC   EKG EKG Interpretation  Date/Time:  Thursday November 02 2020 14:42:32 EDT Ventricular Rate:  80 PR Interval:    QRS Duration: 82 QT Interval:  392 QTC Calculation: 453 R Axis:   56 Text Interpretation: Sinus rhythm Borderline T wave abnormalities No old tracing to compare  Confirmed by Meridee Score [phone]) on 11/02/2020 2:50:13 PM   Radiology CT ABDOMEN PELVIS W CONTRAST  Result Date: 11/02/2020 CLINICAL DATA:  Right lower quadrant pain x1 week. EXAM: CT ABDOMEN AND PELVIS WITH CONTRAST TECHNIQUE: Multidetector CT imaging of the abdomen and pelvis was performed using the standard protocol following bolus administration of intravenous contrast. CONTRAST:  OMNIPAQUE IOHEXOL 300 MG/ML  SOLN COMPARISON:  August 28, 2020 FINDINGS: Lower chest: No acute abnormality. Hepatobiliary: There is diffuse fatty infiltration of the liver parenchyma. No focal liver abnormality is seen. No gallstones, gallbladder wall thickening, or biliary dilatation. Pancreas: Unremarkable. No pancreatic ductal dilatation or surrounding inflammatory changes. Spleen: Normal in size without focal abnormality. Adrenals/Urinary Tract: A 1.1 cm low-attenuation right adrenal mass is seen. A 1.3 cm low-attenuation left adrenal mass is also noted. Kidneys are normal, without renal calculi, focal lesion, or hydronephrosis. Bladder is unremarkable. Stomach/Bowel: Stomach is within normal limits. Appendix appears normal. No evidence of bowel wall thickening, distention, or inflammatory changes. Vascular/Lymphatic: Aortic atherosclerosis. No enlarged abdominal or pelvic lymph nodes. Reproductive: A 3.6 cm x 3.0 cm soft tissue mass is seen within the uterus. An adjacent parenchymal uterine calcification is noted. The bilateral adnexa are unremarkable. Other: No abdominal wall hernia or abnormality. No abdominopelvic  ascites. Musculoskeletal: No acute or significant osseous findings. IMPRESSION: 1. Hepatic steatosis. 2. 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 3. Uterine fibroid. 4. Aortic atherosclerosis. Aortic Atherosclerosis (ICD10-I70.0). Electronically Signed   By: Aram Candela M.D.   On: 11/02/2020 15:35    Procedures Procedures   Medications Ordered in ED Medications  iohexol (OMNIPAQUE) 300 MG/ML solution 100 mL (100 mLs Intravenous Contrast Given 11/02/20 1508)    ED Course  I have reviewed the triage vital signs and the nursing notes.  Pertinent labs & imaging results that were available during my care of the patient were reviewed by me and considered in my medical decision making (see chart for details).  Clinical Course as of 11/02/20 1606  Thu Nov 02, 2020  1550 Glucose, UA(!): >=500 Serum glucose is 165.  Significant glucosuria is likely secondary to her Jardiance use. [LJ]    Clinical Course User Index [LJ] Placido Sou, PA-C   MDM Rules/Calculators/A&P                          Pt is a 65 y.o. female who presents the emergency department due to right lower quadrant pain.  She was sent to the ED for a CT scan by her PCP.  Labs: CBC without abnormalities. CMP with a glucose of 165 and a total bilirubin of 0.2. Lipase of 42. UA showing glucosuria greater than 500, trace hemoglobin, few bacteria.  Glucosuria is likely secondary to patient's Jardiance use.  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  Uterine fibroids.  Aortic atherosclerosis.  I, Placido Sou, PA-C, personally reviewed and evaluated these images and lab results as part of my medical decision-making.  Lab work and imaging today is reassuring.  Unsure of the cause of the patient's pain.  She states she has had similar symptoms in the past and had a  reassuring work-up at that time as well.  Seems like it could possibly be musculoskeletal in nature.  She has no other complaints at this time.  No chest pain, shortness of breath, nausea, vomiting, diarrhea, urinary changes, vaginal discharge, vaginal  bleeding, fevers, chills.  Does not appear to be infectious.  She has no leukocytosis.  Bilateral adnexa appear normal on CT scan.  No GU complaints or urinary complaints.  Feel the patient is stable for discharge and she is agreeable.  She is going to follow-up with her PCP this afternoon.  Also recommended that she follow-up with her gynecologist as well.  She verbalized understanding of the above plan.  Her questions were answered and she was amicable at the time of discharge.  Note: Portions of this report may have been transcribed using voice recognition software. Every effort was made to ensure accuracy; however, inadvertent computerized transcription errors may be present.   Final Clinical Impression(s) / ED Diagnoses Final diagnoses:  Right lower quadrant abdominal pain   Rx / DC Orders ED Discharge Orders    None       Placido SouJoldersma, [place], PA-C 11/02/20 1607    Terrilee FilesButler, Michael C, MD 11/02/20 2047

## 2020-11-02 NOTE — Discharge Instructions (Addendum)
If you develop any new or worsening symptoms please do not hesitate to return to the emergency department.  Please follow-up with both your regular doctor as well as your gynecologist.    It was a pleasure to meet you.

## 2020-11-02 NOTE — ED Triage Notes (Signed)
Right lower quad pain into her right flank for a week. She was seen by her MD prior to coming here.

## 2022-01-18 ENCOUNTER — Encounter (HOSPITAL_COMMUNITY): Payer: Self-pay

## 2022-01-18 ENCOUNTER — Emergency Department (HOSPITAL_COMMUNITY)
Admission: EM | Admit: 2022-01-18 | Discharge: 2022-01-18 | Disposition: A | Discharge status: Home / Self Care | Payer: Medicare HMO | Attending: Emergency Medicine | Admitting: Emergency Medicine

## 2022-01-18 ENCOUNTER — Emergency Department (HOSPITAL_COMMUNITY): Payer: Medicare HMO

## 2022-01-18 ENCOUNTER — Other Ambulatory Visit: Payer: Self-pay

## 2022-01-18 DIAGNOSIS — R0789 Other chest pain: Secondary | ICD-10-CM | POA: Insufficient documentation

## 2022-01-18 DIAGNOSIS — Z7984 Long term (current) use of oral hypoglycemic drugs: Secondary | ICD-10-CM | POA: Diagnosis not present

## 2022-01-18 DIAGNOSIS — Z794 Long term (current) use of insulin: Secondary | ICD-10-CM | POA: Diagnosis not present

## 2022-01-18 DIAGNOSIS — M545 Low back pain, unspecified: Secondary | ICD-10-CM | POA: Diagnosis not present

## 2022-01-18 DIAGNOSIS — E119 Type 2 diabetes mellitus without complications: Secondary | ICD-10-CM | POA: Diagnosis not present

## 2022-01-18 DIAGNOSIS — R079 Chest pain, unspecified: Secondary | ICD-10-CM

## 2022-01-18 DIAGNOSIS — Z79899 Other long term (current) drug therapy: Secondary | ICD-10-CM | POA: Diagnosis not present

## 2022-01-18 DIAGNOSIS — I10 Essential (primary) hypertension: Secondary | ICD-10-CM | POA: Diagnosis not present

## 2022-01-18 LAB — BASIC METABOLIC PANEL
Anion gap: 8 (ref 5–15)
BUN: 24 mg/dL — ABNORMAL HIGH (ref 8–23)
CO2: 24 mmol/L (ref 22–32)
Calcium: 9.5 mg/dL (ref 8.9–10.3)
Chloride: 109 mmol/L (ref 98–111)
Creatinine, Ser: 0.8 mg/dL (ref 0.44–1.00)
GFR, Estimated: >60 mL/min (ref >60)
Glucose, Bld: 199 mg/dL — ABNORMAL HIGH (ref 70–99)
Potassium: 3.8 mmol/L (ref 3.5–5.1)
Sodium: 141 mmol/L (ref 135–145)

## 2022-01-18 LAB — CBC
HCT: 39.6 % (ref 36.0–46.0)
Hemoglobin: 13.2 g/dL (ref 12.0–15.0)
MCH: 29 pg (ref 26.0–34.0)
MCHC: 33.3 g/dL (ref 30.0–36.0)
MCV: 87 fL (ref 80.0–100.0)
Platelets: 237 10*3/uL (ref 150–400)
RBC: 4.55 MIL/uL (ref 3.87–5.11)
RDW: 13 % (ref 11.5–15.5)
WBC: 10.2 10*3/uL (ref 4.0–10.5)
nRBC: 0 % (ref 0.0–0.2)

## 2022-01-18 LAB — TROPONIN I (HIGH SENSITIVITY)
Troponin I (High Sensitivity): 4 ng/L (ref <18)
Troponin I (High Sensitivity): 4 ng/L (ref <18)

## 2022-01-18 NOTE — ED Provider Triage Note (Signed)
Emergency Medicine Provider Triage Evaluation Note  Dandria Griego , a 66 y.o. female  was evaluated in triage.  Pt complains of CP. Intermittent L sided CP radiates to L arm x 1 week.  No fever, cough, sob, nausea  Review of Systems  Positive: As above Negative: As above  Physical Exam  BP (!) 161/66 (BP Location: Right Arm)   Pulse 83   Temp 98.1 F (36.7 C) (Oral)   Resp 16   Ht 5\' 2"  (1.575 m)   Wt 78 kg   SpO2 94%   BMI 31.46 kg/m  Gen:   Awake, no distress   Resp:  Normal effort  MSK:   Moves extremities without difficulty  Other:    Medical Decision Making  Medically screening exam initiated at 5:17 PM.  Appropriate orders placed.  was informed that the remainder of the evaluation will be completed by another provider, this initial triage assessment does not replace that evaluation, and the importance of remaining in the ED until their evaluation is complete.     [name], PA-C 01/18/22 1722

## 2022-01-18 NOTE — ED Provider Notes (Signed)
[facility] Provider Note   CSN: [id number] Arrival date & time: 01/18/22  1653     History {Add pertinent medical, surgical, social history, OB history to HPI:1} Chief Complaint  Patient presents with   Chest Pain    Olivia Garza is a 66 y.o. female presented emerged apartment with chest discomfort.  She reports she has had the symptoms on and off over the years, typically with a pressure near her left shoulder, but for the past week it has also been associated with radiation down her left upper arm.  She denies nausea, vomiting, shortness of breath, lightheadedness, or any other symptoms.  She is not certain that this is associated with activity, but it has been more or less constant for a week.  She took naproxen at night and is not certain whether this helps.  She wanted to come get herself checked out from a cardiac perspective.  She has not had a cardiac work-up or stress test in at least 20 years.  She reports she had a "clean coronary catheterization" at that time.  She does report a history of high blood pressure, diabetes, high cholesterol.  She denies history of smoking or significant family history of MI or coronary disease.  She does report that she suffers from spine issues and lower back and chronic back pain.  HPI     Home Medications Prior to Admission medications   Medication Sig Start Date End Date Taking? Authorizing Provider  clindamycin (CLEOCIN) 300 MG capsule Take 1 capsule (300 mg total) by mouth 3 (three) times daily. 03/18/18   Ofilia Neaslark, Michael L, PA-C  Dulaglutide (TRULICITY [place]) Inject into the skin.    [provider]  empagliflozin (JARDIANCE) 25 MG TABS tablet Take by mouth. 04/12/20   [provider]  FENOFIBRATE PO Take by mouth.    [provider]  Insulin Glargine (LANTUS [place]) Inject into the skin.    [provider]  losartan (COZAAR) 25 MG tablet Take 1 tablet by mouth daily.  06/02/20   [provider]  metformin (FORTAMET) 1000 MG (OSM) 24 hr tablet Take 1,000 mg by mouth daily with breakfast.    [provider]  naproxen (NAPROSYN) 500 MG tablet Take 1 tablet (500 mg total) by mouth 2 (two) times daily with a meal. 03/06/18   Ofilia Neaslark, Michael L, PA-C  rosuvastatin (CRESTOR) 20 MG tablet Take 1 tablet by mouth daily. 06/02/20   [provider]  traMADol (ULTRAM) 50 MG tablet Take 1 tablet (50 mg total) by mouth every 8 (eight) hours as needed. 03/18/18   Ofilia Neaslark, Michael L, PA-C      Allergies    Atorvastatin    Review of Systems   Review of Systems  Physical Exam Updated Vital Signs BP (!) 155/74   Pulse 66   Temp 98.1 F (36.7 C) (Oral)   Resp 10   Ht 5\' 2"  (1.575 m)   Wt 78 kg   SpO2 98%   BMI 31.46 kg/m  Physical Exam Constitutional:      General: She is not in acute distress. HENT:     Head: Normocephalic and atraumatic.  Eyes:     Conjunctiva/sclera: Conjunctivae normal.     Pupils: Pupils are equal, round, and reactive to light.  Cardiovascular:     Rate and Rhythm: Normal rate and regular rhythm.  Pulmonary:     Effort: Pulmonary effort is normal. No respiratory distress.  Abdominal:  General: There is no distension.     Tenderness: There is no abdominal tenderness.  Skin:    General: Skin is warm and dry.  Neurological:     General: No focal deficit present.     Mental Status: She is alert. Mental status is at baseline.  Psychiatric:        Mood and Affect: Mood normal.        Behavior: Behavior normal.    ED Results / Procedures / Treatments   Labs (all labs ordered are listed, but only abnormal results are displayed) Labs Reviewed  BASIC METABOLIC PANEL - Abnormal; Notable for the following components:      Result Value   Glucose, Bld 199 (*)    BUN 24 (*)    All other components within normal limits  CBC  TROPONIN I (HIGH SENSITIVITY)  TROPONIN I (HIGH SENSITIVITY)     EKG None  Radiology DG Chest 1 View  Result Date: 01/18/2022 CLINICAL DATA:  Left chest pain radiating to the arm EXAM: CHEST  1 VIEW COMPARISON:  Chest radiograph 05/15/2020 FINDINGS: The cardiomediastinal silhouette is normal. There is no focal consolidation or pulmonary edema. There is no pleural effusion or pneumothorax. There is no acute osseous abnormality. IMPRESSION: No radiographic evidence of acute cardiopulmonary process. Electronically Signed   By: Lesia Hausen M.D.   On: 01/18/2022 17:45    Procedures Procedures  {Document cardiac monitor, telemetry assessment procedure when appropriate:1}  Medications Ordered in ED Medications - No data to display  ED Course/ Medical Decision Making/ A&P                           Medical Decision Making  This patient presents to the Emergency Department with complaint of chest pain. This involves an extensive number of treatment options, and is a complaint that carries with it a high risk of complications and morbidity, given the patient's comorbidity, including cardiac risk factors.The differential diagnosis includes ACS vs Pneumothorax vs Reflux/Gastritis vs MSK pain vs Pneumonia vs other.  I felt PE was less likely given that she has no pleuritic symptoms, she is not hypoxic or tachycardic, the symptoms are intermittent over the past several months  I ordered, reviewed, and interpreted labs.  Pertinent results include delta troponin is flat, no leukocytosis or evidence of acute ischemia The patient is very minimal discomfort in the room and not requiring pain medication I ordered imaging studies which included x-ray of the chest I independently visualized and interpreted imaging which showed no focal infiltrate or pneumothorax and the monitor tracing which showed normal sinus rhythm. I agree with the radiologist interpretation  I personally reviewed the patients ECG which showed sinus rhythm with no acute ischemic findings  After  the interventions stated above, I reevaluated the patient and found that they were minimally symptomatic after 5 hours.  HEART score of 4.  I engaged in shared decision-making with the patient and her husband explained that she has moderate risk for MACE in the next 30 days, and would need cardiology referral and follow-up given her risk factors.  We can place a referral.  They prefer to do this as an outpatient.  They would prefer to go home tonight, and reports that they will keep an eye on her symptoms at home, if she were to develop new symptoms associated with the chest pain, she return immediately to the ER.  I think this is a reasonable plan.  She can start a baby aspirin daily until she sees cardiology.  {Document critical care time when appropriate:1} {Document review of labs and clinical decision tools ie heart score, Chads2Vasc2 etc:1}  {Document your independent review of radiology images, and any outside records:1} {Document your discussion with family members, caretakers, and with consultants:1} {Document social determinants of health affecting pt's care:1} {Document your decision making why or why not admission, treatments were needed:1} Final Clinical Impression(s) / ED Diagnoses Final diagnoses:  None    Rx / DC Orders ED Discharge Orders     None

## 2022-01-18 NOTE — ED Notes (Signed)
Discharge instructions reviewed with patient. Patient verbalized understanding of instructions. Follow-up care and medications were reviewed. Patient ambulatory with steady gait. VSS upon discharge.  ?

## 2022-01-18 NOTE — ED Triage Notes (Signed)
Chest pain that started x 1 week radiating down left arm.  Denies SOB n/v

## 2022-01-18 NOTE — Discharge Instructions (Signed)
You were diagnosed with chest pain today.  This is a non-specific diagnosis, but your provider did not feel this was a life-threatening condition at this time.  Chest pain is a common presenting condition in the Emergency Department, and it is not uncommon for patients to leave without a specific diagnosis.  It is important to remember that your workup today was not a complete medical workup.  You may still have a serious medical attention that needs follow up care with a specialist. If your provider referred you to see a specialist, it is VERY important that you call to set up an appointment with them.    It is also important that you speak to your primary care provider in 1-2 days after this visit to the ER.  Your PCP may want to see you in the office, or else they may want you to follow up with the specialist.  SEEK IMMEDIATE MEDICAL ATTENTION IF:  You have severe chest pain, especially if the pain is crushing or pressure-like and spreads to the arms, back, neck, or jaw, or if you have sweating, nausea (feeling sick to your stomach), or shortness of breath. THIS IS AN EMERGENCY. Don't wait to see if the pain will go away. Get medical help at once. Call 911 or [phone] (operator). DO NOT drive yourself to the hospital.   Your chest pain gets worse and does not go away with rest.  You have an attack of chest pain lasting longer than usual, despite rest and treatment with the medications your caregiver has prescribed.  You wake from sleep with chest pain or shortness of breath.  You feel dizzy or faint.  You have chest pain not typical of your usual pain for which you originally saw your caregiver.  

## 2022-01-24 NOTE — Progress Notes (Signed)
Cardiology Office Note:   Date:  01/25/2022  NAME:  Olivia Garza    MRN: [id number] DOB:  [birth year]   PCP:  Deborah Chalk, FNP  Cardiologist:  None  Electrophysiologist:  None   Referring MD: Deborah Chalk, FNP   Chief Complaint  Patient presents with   Chest Pain    History of Present Illness:   Olivia Garza is a 66 y.o. female with a hx of DM who is being seen today for the evaluation of chest pain at the request of Deborah Chalk, FNP. ER visit 01/18/2022 for CP. Negative work-up.   She reports for the last 2 weeks has had a discomfort in her chest.  Described as in the center of her chest.  She reports she can have arm numbness.  Symptoms occur randomly.  No triggers.  No alleviating factors.  She does report significant stress.  Her husband will undergo neck surgery next week.  She is concerned about this.  She was seen by her primary care physician and sent to the emergency room.  Work-up there was negative.  She is continue to have symptoms on and off.  Symptoms can last hours.  She does associate some of this with eating.  I do question acid reflux.  Symptoms seem to come and go.  They are not constant.  Stress may trigger this as well.  Medical history is notable for uncontrolled diabetes.  Most recent A1c 9.3.  LDL 62.  She has never had a heart attack or stroke.  Her blood pressure is well controlled on medication.  She is a never smoker.  She does not drink alcohol in excess.  No drug use.  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  She is married.  No exercise.  Most recent labs also show an elevated TSH.  Likely needs treatment for this.  She will discuss this with her endocrinologist.  She also has elevated triglycerides but they are improved.  Most recent serum creatinine normal.  [facility] was normal.  Exam normal.  Of note she reports similar symptoms in the past.  Had a left heart catheterization 30 years  ago that she tells me was normal.  Apparently all of her symptoms at that time were reported to be stress related.  Problem List DM -A1c 9.3 HLD -T chol 132, HDL 42, LDL 62, TG 159 HTN  Past Medical History: Past Medical History:  Diagnosis Date   Diabetes mellitus without complication ([place])    Hyperlipidemia    Hypertension     Past Surgical History: Past Surgical History:  Procedure Laterality Date   CARDIAC CATHETERIZATION      Current Medications: Current Meds  Medication Sig   empagliflozin (JARDIANCE) 25 MG TABS tablet Take by mouth.   Insulin Glargine (LANTUS [place]) Inject into the skin.   losartan (COZAAR) 25 MG tablet Take 1 tablet by mouth daily.   metformin (FORTAMET) 1000 MG (OSM) 24 hr tablet Take 1,000 mg by mouth 2 (two) times daily.   metoprolol tartrate (LOPRESSOR) 100 MG tablet Take 1 tablet by mouth once for procedure.   naproxen (NAPROSYN) 500 MG tablet Take 1 tablet (500 mg total) by mouth 2 (two) times daily with a meal. (Patient taking differently: Take 500 mg by mouth as needed.)   rosuvastatin (CRESTOR) 20 MG tablet Take 1 tablet by mouth daily.     Allergies:  Atorvastatin   Social History: Social History   Socioeconomic History   Marital status: Married    Spouse name: Not on file   Number of children: 4   Years of education: Not on file   Highest education level: Not on file  Occupational History   Occupation: Book keeping  Tobacco Use   Smoking status: Former   Smokeless tobacco: Never  Scientific laboratory technician Use: Never used  Substance and Sexual Activity   Alcohol use: Yes    Alcohol/week: 3.0 standard drinks of alcohol    Types: 3 Standard drinks or equivalent per week   Drug use: Never   Sexual activity: Not on file  Other Topics Concern   Not on file  Social History Narrative   Not on file   Social Determinants of Health   Financial Resource Strain: Not on file  Food Insecurity: Not on file  Transportation Needs: Not on  file  Physical Activity: Not on file  Stress: Not on file  Social Connections: Not on file     Family History: The patient's family history includes Cancer in her father and mother; Diabetes in her father; Heart disease in her father.  ROS:   All other ROS reviewed and negative. Pertinent positives noted in the HPI.     EKGs/Labs/Other Studies Reviewed:   The following studies were personally reviewed by me today:  EKG:  EKG is ordered today.  The ekg ordered today demonstrates normal sinus rhythm heart rate 79, no acute ischemic changes or evidence of infarction, and was personally reviewed by me.   Recent Labs: 01/18/2022: BUN 24; Creatinine, Ser 0.80; Hemoglobin 13.2; Platelets 237; Potassium 3.8; Sodium 141   Recent Lipid Panel No results found for: "CHOL", "TRIG", "HDL", "CHOLHDL", "VLDL", "LDLCALC", "LDLDIRECT"  Physical Exam:   VS:  BP 132/66 (BP Location: Left Arm, Patient Position: Sitting, Cuff Size: Normal)   Pulse 79   Ht 5\' 2"  (1.575 m)   Wt 174 lb 9.6 oz (79.2 kg)   SpO2 97%   BMI 31.93 kg/m    Wt Readings from Last 3 Encounters:  01/25/22 174 lb 9.6 oz (79.2 kg)  01/18/22 172 lb (78 kg)  11/02/20 170 lb 3.2 oz (77.2 kg)    General: Well nourished, well developed, in no acute distress Head: Atraumatic, normal size  Eyes: PEERLA, EOMI  Neck: Supple, no JVD Endocrine: No thryomegaly Cardiac: Normal S1, S2; RRR; no murmurs, rubs, or gallops Lungs: Clear to auscultation bilaterally, no wheezing, rhonchi or rales  Abd: Soft, nontender, no hepatomegaly  Ext: No edema, pulses 2+ Musculoskeletal: No deformities, BUE and BLE strength normal and equal Skin: Warm and dry, no rashes   Neuro: Alert and oriented to person, place, time, and situation, CNII-XII grossly intact, no focal deficits  Psych: Normal mood and affect   ASSESSMENT:   Olivia Garza is a 66 y.o. female who presents for the following: 1. Precordial pain   2. Mixed hyperlipidemia     PLAN:    1. Precordial pain 2. Mixed hyperlipidemia -She reports intermittent discomfort in her chest.  Symptoms can occur randomly.  No identifiable trigger or alleviating factor.  Symptoms do not appear to be exertional or alleviated by rest.  She does report some association with food.  Recent work-up in the ER was negative.  CV exam today is normal.  EKG is normal.  Everything is reassuring that she likely does not have underlying CAD.  Risk factors include uncontrolled diabetes  and hypertension and obesity and inactivity.  I recommended coronary CTA to exclude this.  She will take 100 mg metoprolol tartrate 2 hours before the scan.  Recent lab work through her primary care physician shows a creatinine of 0.83.  She does not need a BMP.  All of her other risk factors are well controlled including lipids and hypertension.  She will work on her diabetes.  I do wonder about her TSH.  I will defer this to her primary care physician but she may need treatment.  She will see me back as needed based on the results of her scan.  Disposition: Return if symptoms worsen or fail to improve.  Medication Adjustments/Labs and Tests Ordered: Current medicines are reviewed at length with the patient today.  Concerns regarding medicines are outlined above.  Orders Placed This Encounter  Procedures   CT CORONARY MORPH W/CTA COR W/SCORE W/CA W/CM &/OR WO/CM   EKG 12-Lead   Meds ordered this encounter  Medications   metoprolol tartrate (LOPRESSOR) 100 MG tablet    Sig: Take 1 tablet by mouth once for procedure.    Dispense:  1 tablet    Refill:  0    Patient Instructions  Medication Instructions:  Take Metoprolol 100 mg two hours before CT when scheduled.   *If you need a refill on your cardiac medications before your next appointment, please call your pharmacy*   Testing/Procedures: Your physician has requested that you have cardiac CT. Cardiac computed tomography (CT) is a painless test that uses an x-ray  machine to take clear, detailed pictures of your heart. For further information please visit [email or web address]. Please follow instruction sheet as given.   Follow-Up: At [facility], you and your health needs are our priority.  As part of our continuing mission to provide you with exceptional heart care, we have created designated Provider Care Teams.  These Care Teams include your primary Cardiologist (physician) and Advanced Practice Providers (APPs -  Physician Assistants and Nurse Practitioners) who all work together to provide you with the care you need, when you need it.  We recommend signing up for the patient portal called "MyChart".  Sign up information is provided on this After Visit Summary.  MyChart is used to connect with patients for Virtual Visits (Telemedicine).  Patients are able to view lab/test results, encounter notes, upcoming appointments, etc.  Non-urgent messages can be sent to your provider as well.   To learn more about what you can do with MyChart, go to [email or web address].    Your next appointment:   As needed  The format for your next appointment:   In Person  Provider:   Eleonore Chiquito, MD   Other Instructions   Your cardiac CT will be scheduled at one of the below locations:   [facility] [address] [place], [place] [address] [phone]  If scheduled at [facility], please arrive at the [facility] and Children's Entrance (Entrance C2) of [facility] 30 minutes prior to test start time. You can use the FREE valet parking offered at entrance C (encouraged to control the heart rate for the test)  Proceed to the [facility] Radiology Department (first floor) to check-in and test prep.  All radiology patients and guests should use entrance C2 at [facility], accessed from [employer], even though the hospital's physical address listed is [address].      Please follow these  instructions carefully (unless otherwise directed):  On the Night Before the Test: Be sure to Drink plenty of water. Do not consume any caffeinated/decaffeinated beverages or chocolate 12 hours prior to your test. Do not take any antihistamines 12 hours prior to your test.  On the Day of the Test: Drink plenty of water until 1 hour prior to the test. Do not eat any food 4 hours prior to the test. You may take your regular medications prior to the test.  Take metoprolol (Lopressor) two hours prior to test. HOLD Furosemide/Hydrochlorothiazide morning of the test. FEMALES- please wear underwire-free bra if available, avoid dresses & tight clothing      After the Test: Drink plenty of water. After receiving IV contrast, you may experience a mild flushed feeling. This is normal. On occasion, you may experience a mild rash up to 24 hours after the test. This is not dangerous. If this occurs, you can take Benadryl 25 mg and increase your fluid intake. If you experience trouble breathing, this can be serious. If it is severe call 911 IMMEDIATELY. If it is mild, please call our office. If you take any of these medications: Glipizide/Metformin, Avandament, Glucavance, please do not take 48 hours after completing test unless otherwise instructed.  We will call to schedule your test 2-4 weeks out understanding that some insurance companies will need an authorization prior to the service being performed.   For non-scheduling related questions, please contact the cardiac imaging nurse navigator should you have any questions/concerns: Marchia Bond, Cardiac Imaging Nurse Navigator Gordy Clement, Cardiac Imaging Nurse Navigator [facility] Direct Office Dial: [phone]   For scheduling needs, including cancellations and rescheduling, please call [place], [phone].            Signed, Addison Naegeli. Audie Box, MD, [place]  [address] [place], [place] [address] [phone]  01/25/2022 3:31 PM

## 2022-01-25 ENCOUNTER — Encounter: Payer: Self-pay | Admitting: Cardiovascular Disease

## 2022-01-25 ENCOUNTER — Ambulatory Visit (INDEPENDENT_AMBULATORY_CARE_PROVIDER_SITE_OTHER): Payer: Medicare HMO | Admitting: Cardiovascular Disease

## 2022-01-25 VITALS — BP 132/66 | HR 79 | Ht 62.0 in | Wt 174.6 lb

## 2022-01-25 DIAGNOSIS — R072 Precordial pain: Secondary | ICD-10-CM

## 2022-01-25 DIAGNOSIS — E782 Mixed hyperlipidemia: Secondary | ICD-10-CM | POA: Diagnosis not present

## 2022-01-25 MED ORDER — METOPROLOL TARTRATE 100 MG PO TABS: ORAL_TABLET | ORAL | 0 refills | Status: DC

## 2022-01-25 NOTE — Patient Instructions (Signed)
Medication Instructions:  Take Metoprolol 100 mg two hours before CT when scheduled.   *If you need a refill on your cardiac medications before your next appointment, please call your pharmacy*   Testing/Procedures: Your physician has requested that you have cardiac CT. Cardiac computed tomography (CT) is a painless test that uses an x-ray machine to take clear, detailed pictures of your heart. For further information please visit [email or web address] Please follow instruction sheet as given.   Follow-Up: At [facility], you and your health needs are our priority.  As part of our continuing mission to provide you with exceptional heart care, we have created designated Provider Care Teams.  These Care Teams include your primary Cardiologist (physician) and Advanced Practice Providers (APPs -  Physician Assistants and Nurse Practitioners) who all work together to provide you with the care you need, when you need it.  We recommend signing up for the patient portal called "MyChart".  Sign up information is provided on this After Visit Summary.  MyChart is used to connect with patients for Virtual Visits (Telemedicine).  Patients are able to view lab/test results, encounter notes, upcoming appointments, etc.  Non-urgent messages can be sent to your provider as well.   To learn more about what you can do with MyChart, go to [email or web address].    Your next appointment:   As needed  The format for your next appointment:   In Person  Provider:   Lennie Odor, MD   Other Instructions   Your cardiac CT will be scheduled at one of the below locations:   [facility] [address] [place], [place] [address] [phone]  If scheduled at [facility], please arrive at the [facility] and Children's Entrance (Entrance C2) of [facility] 30 minutes prior to test start time. You can use the FREE valet parking offered at entrance C (encouraged to control the  heart rate for the test)  Proceed to the [facility] Radiology Department (first floor) to check-in and test prep.  All radiology patients and guests should use entrance C2 at [facility], accessed from [facility], even though the hospital's physical address listed is [address]      Please follow these instructions carefully (unless otherwise directed):   On the Night Before the Test: Be sure to Drink plenty of water. Do not consume any caffeinated/decaffeinated beverages or chocolate 12 hours prior to your test. Do not take any antihistamines 12 hours prior to your test.  On the Day of the Test: Drink plenty of water until 1 hour prior to the test. Do not eat any food 4 hours prior to the test. You may take your regular medications prior to the test.  Take metoprolol (Lopressor) two hours prior to test. HOLD Furosemide/Hydrochlorothiazide morning of the test. FEMALES- please wear underwire-free bra if available, avoid dresses & tight clothing      After the Test: Drink plenty of water. After receiving IV contrast, you may experience a mild flushed feeling. This is normal. On occasion, you may experience a mild rash up to 24 hours after the test. This is not dangerous. If this occurs, you can take Benadryl 25 mg and increase your fluid intake. If you experience trouble breathing, this can be serious. If it is severe call 911 IMMEDIATELY. If it is mild, please call our office. If you take any of these medications: Glipizide/Metformin, Avandament, Glucavance, please do not take 48 hours after completing test unless otherwise  instructed.  We will call to schedule your test 2-4 weeks out understanding that some insurance companies will need an authorization prior to the service being performed.   For non-scheduling related questions, please contact the cardiac imaging nurse navigator should you have any questions/concerns: Rockwell [place], Cardiac Imaging  Nurse Navigator Larey Brick, Cardiac Imaging Nurse Navigator [facility] Direct Office Dial: [phone]   For scheduling needs, including cancellations and rescheduling, please call [place], [phone].

## 2022-02-15 ENCOUNTER — Telehealth (HOSPITAL_COMMUNITY): Payer: Self-pay | Admitting: *Deleted

## 2022-02-15 NOTE — Telephone Encounter (Signed)
Attempted to call patient regarding upcoming cardiac CT appointment. °Left message on voicemail with name and callback number ° °Shalah Estelle RN Navigator Cardiac Imaging °[facility] °[phone] Office °[phone] Cell ° °

## 2022-02-18 ENCOUNTER — Ambulatory Visit (HOSPITAL_COMMUNITY)
Admission: RE | Admit: 2022-02-18 | Discharge: 2022-02-18 | Disposition: A | Discharge status: Home / Self Care | Payer: Medicare HMO | Source: Ambulatory Visit | Attending: Cardiovascular Disease | Admitting: Cardiovascular Disease

## 2022-02-18 ENCOUNTER — Encounter (HOSPITAL_COMMUNITY): Payer: Self-pay

## 2022-02-18 DIAGNOSIS — R931 Abnormal findings on diagnostic imaging of heart and coronary circulation: Secondary | ICD-10-CM | POA: Diagnosis present

## 2022-02-18 DIAGNOSIS — I2510 Atherosclerotic heart disease of native coronary artery without angina pectoris: Secondary | ICD-10-CM

## 2022-02-18 DIAGNOSIS — R072 Precordial pain: Secondary | ICD-10-CM | POA: Diagnosis present

## 2022-02-18 MED ORDER — NITROGLYCERIN 0.4 MG SL SUBL
0.8000 mg | SUBLINGUAL_TABLET | Freq: Once | SUBLINGUAL | Status: AC
  Administered 2022-02-18: 0.8 mg via SUBLINGUAL

## 2022-02-18 MED ORDER — IOHEXOL 350 MG/ML SOLN
100.0000 mL | Freq: Once | INTRAVENOUS | Status: AC | PRN
  Administered 2022-02-18: 100 mL via INTRAVENOUS

## 2022-02-18 MED ORDER — NITROGLYCERIN 0.4 MG SL SUBL
SUBLINGUAL_TABLET | SUBLINGUAL | Status: AC
  Filled 2022-02-18: qty 2

## 2022-02-20 ENCOUNTER — Other Ambulatory Visit: Payer: Self-pay | Admitting: Cardiology

## 2022-02-20 DIAGNOSIS — R931 Abnormal findings on diagnostic imaging of heart and coronary circulation: Secondary | ICD-10-CM | POA: Diagnosis not present

## 2022-02-21 ENCOUNTER — Other Ambulatory Visit: Payer: Self-pay

## 2022-02-21 ENCOUNTER — Ambulatory Visit (HOSPITAL_COMMUNITY)
Admission: RE | Admit: 2022-02-21 | Discharge: 2022-02-21 | Disposition: A | Discharge status: Home / Self Care | Payer: Medicare HMO | Source: Ambulatory Visit | Attending: Cardiology | Admitting: Cardiology

## 2022-02-21 DIAGNOSIS — R931 Abnormal findings on diagnostic imaging of heart and coronary circulation: Secondary | ICD-10-CM

## 2022-02-21 MED ORDER — ASPIRIN 81 MG PO TBEC: 81.0000 mg | DELAYED_RELEASE_TABLET | Freq: Every day | ORAL | 3 refills | Status: AC

## 2022-02-21 MED ORDER — NITROGLYCERIN 0.4 MG SL SUBL
0.4000 mg | SUBLINGUAL_TABLET | SUBLINGUAL | 3 refills | Status: AC | PRN
Start: 2022-02-21 — End: 2022-05-22

## 2022-02-21 MED ORDER — METOPROLOL TARTRATE 25 MG PO TABS: 25.0000 mg | ORAL_TABLET | Freq: Two times a day (BID) | ORAL | 3 refills | Status: DC

## 2022-02-25 NOTE — Progress Notes (Unsigned)
Cardiology Office Note:   Date:  02/26/2022  NAME:  Olivia Garza    MRN: [id number] DOB:  [birth year]   PCP:  Eather Colas, FNP  Cardiologist:  None  Electrophysiologist:  None   Referring MD: Eather Colas, FNP   Chief Complaint  Patient presents with   Follow-up         History of Present Illness:   Olivia Garza is a 66 y.o. female with a hx of DM, HTN, HLD who presents for follow-up.  She is still getting chest discomfort.  Described as pressure and tightness in her chest.  Occurs randomly.  Not exertional.  Not alleviated by rest.  EKG is normal.  She is getting short of breath as well.  Heart catheterization recommended due to abnormal coronary CTA.  Concern for severe stenoses in the mid LAD, distal RCA and OM1.  She is okay to proceed with left heart catheterization.  I would like to definitively know what her coronary anatomy is.   Problem List DM -A1c 9.3 HLD -T chol 132, HDL 42, LDL 62, TG 159 HTN CAD -severe mid LAD 70-99% -severe distal RCA 70-99% -severe OM1  Past Medical History: Past Medical History:  Diagnosis Date   Diabetes mellitus without complication (HCC)    Hyperlipidemia    Hypertension     Past Surgical History: Past Surgical History:  Procedure Laterality Date   CARDIAC CATHETERIZATION      Current Medications: Current Meds  Medication Sig   aspirin EC 81 MG tablet Take 1 tablet (81 mg total) by mouth daily. Swallow whole.   empagliflozin (JARDIANCE) 25 MG TABS tablet Take by mouth.   Insulin Glargine (LANTUS [place]) Inject into the skin.   losartan (COZAAR) 25 MG tablet Take 1 tablet by mouth daily.   metformin (FORTAMET) 1000 MG (OSM) 24 hr tablet Take 1,000 mg by mouth 2 (two) times daily.   metoprolol tartrate (LOPRESSOR) 25 MG tablet Take 1 tablet (25 mg total) by mouth 2 (two) times daily.   naproxen (NAPROSYN) 500 MG tablet Take 1 tablet (500 mg total) by mouth 2 (two) times daily with a meal. (Patient taking differently:  Take 500 mg by mouth as needed.)   nitroGLYCERIN (NITROSTAT) 0.4 MG SL tablet Place 1 tablet (0.4 mg total) under the tongue every 5 (five) minutes as needed for chest pain.   rosuvastatin (CRESTOR) 20 MG tablet Take 1 tablet by mouth daily.     Allergies:    Atorvastatin   Social History: Social History   Socioeconomic History   Marital status: Married    Spouse name: Not on file   Number of children: 4   Years of education: Not on file   Highest education level: Not on file  Occupational History   Occupation: Book keeping  Tobacco Use   Smoking status: Former   Smokeless tobacco: Never  Building services engineer Use: Never used  Substance and Sexual Activity   Alcohol use: Yes    Alcohol/week: 3.0 standard drinks of alcohol    Types: 3 Standard drinks or equivalent per week   Drug use: Never   Sexual activity: Not on file  Other Topics Concern   Not on file  Social History Narrative   Not on file   Social Determinants of Health   Financial Resource Strain: Not on file  Food Insecurity: Not on file  Transportation Needs: Not on file  Physical Activity: Not on file  Stress: Not on file  Social Connections: Not on file     Family History: The patient's family history includes Cancer in her father and mother; Diabetes in her father; Heart disease in her father.  ROS:   All other ROS reviewed and negative. Pertinent positives noted in the HPI.     EKGs/Labs/Other Studies Reviewed:   The following studies were personally reviewed by me today:  EKG:  EKG is ordered today.  The ekg ordered today demonstrates normal sinus rhythm heart rate 72, no acute ischemic changes or evidence of infarction, and was personally reviewed by me.   Recent Labs: 01/18/2022: BUN 24; Creatinine, Ser 0.80; Hemoglobin 13.2; Platelets 237; Potassium 3.8; Sodium 141   Recent Lipid Panel No results found for: "CHOL", "TRIG", "HDL", "CHOLHDL", "VLDL", "LDLCALC", "LDLDIRECT"  Physical Exam:    VS:  BP 118/68   Pulse 72   Ht 5' 2" (1.575 m)   Wt 173 lb 12.8 oz (78.8 kg)   SpO2 93%   BMI 31.79 kg/m    Wt Readings from Last 3 Encounters:  02/26/22 173 lb 12.8 oz (78.8 kg)  01/25/22 174 lb 9.6 oz (79.2 kg)  01/18/22 172 lb (78 kg)    General: Well nourished, well developed, in no acute distress Head: Atraumatic, normal size  Eyes: PEERLA, EOMI  Neck: Supple, no JVD Endocrine: No thryomegaly Cardiac: Normal S1, S2; RRR; no murmurs, rubs, or gallops Lungs: Clear to auscultation bilaterally, no wheezing, rhonchi or rales  Abd: Soft, nontender, no hepatomegaly  Ext: No edema, pulses 2+ Musculoskeletal: No deformities, BUE and BLE strength normal and equal Skin: Warm and dry, no rashes   Neuro: Alert and oriented to person, place, time, and situation, CNII-XII grossly intact, no focal deficits  Psych: Normal mood and affect   ASSESSMENT:   Olivia Garza is a 66 y.o. female who presents for the following: 1. Coronary artery disease involving native coronary artery of native heart with unstable angina pectoris (HCC)   2. Mixed hyperlipidemia     PLAN:   1. Coronary artery disease involving native coronary artery of native heart with unstable angina pectoris (HCC) 2. Mixed hyperlipidemia -Persistent chest pain symptoms.  Coronary CTA with concerning for severe stenoses in the mid LAD as well as distal RCA.  The OM branch also was concerning for severe stenosis.  FFR values are borderline.  Given concern for multivessel CAD and an OM branch that is too small for FFR analysis I recommended left heart catheterization with possible percutaneous coronary intervention.  She will continue aspirin and statin.  She is on metoprolol tartrate 25 mg twice daily.  She is still having symptoms.  Blood pressure is normal.  I fear she may be developing unstable angina.  Given her diabetes she is high risk for multivessel CAD.  We will plan for left heart catheterization on 03/08/2022.  She has  nitroglycerin.  Strict return precautions were given.  She understands how to use nitro.  If she has pain for 15 minutes despite 3 doses of nitroglycerin at 3 to 5 minutes per dose she will go to the hospital.  We will reduce her insulin to half the dose the night before.  Metformin will be held after heart catheterization.  Lab work today.  She will see me back in 2 weeks after heart catheterization.  She also needs an echo.  Shared Decision Making/Informed Consent The risks [stroke (1 in 1000), death (1 in 1000), kidney failure [usually temporary] (1 in 500), bleeding (1 in 200),   allergic reaction [possibly serious] (1 in 200)], benefits (diagnostic support and management of coronary artery disease) and alternatives of a cardiac catheterization were discussed in detail with Ms. Brun and she is willing to proceed.  Disposition: Return in about 2 weeks (around 03/12/2022).  Medication Adjustments/Labs and Tests Ordered: Current medicines are reviewed at length with the patient today.  Concerns regarding medicines are outlined above.  Orders Placed This Encounter  Procedures   CBC   Basic metabolic panel   EKG 12-Lead   ECHOCARDIOGRAM COMPLETE   No orders of the defined types were placed in this encounter.   Patient Instructions  Medication Instructions:  The current medical regimen is effective;  continue present plan and medications.  -review changes for heart cath  *If you need a refill on your cardiac medications before your next appointment, please call your pharmacy*   Lab Work: CBC, BMET today   If you have labs (blood work) drawn today and your tests are completely normal, you will receive your results only by: MyChart Message (if you have MyChart) OR A paper copy in the mail If you have any lab test that is abnormal or we need to change your treatment, we will call you to review the results.   Testing/Procedures:  Your physician has requested that you have a cardiac  catheterization. Cardiac catheterization is used to diagnose and/or treat various heart conditions. Doctors may recommend this procedure for a number of different reasons. The most common reason is to evaluate chest pain. Chest pain can be a symptom of coronary artery disease (CAD), and cardiac catheterization can show whether plaque is narrowing or blocking your heart's arteries. This procedure is also used to evaluate the valves, as well as measure the blood flow and oxygen levels in different parts of your heart. For further information please visit [email or web address] Please follow instruction sheet, as given.  Echocardiogram - Your physician has requested that you have an echocardiogram. Echocardiography is a painless test that uses sound waves to create images of your heart. It provides your doctor with information about the size and shape of your heart and how well your heart's chambers and valves are working. This procedure takes approximately one hour. There are no restrictions for this procedure.    Follow-Up: At [facility], you and your health needs are our priority.  As part of our continuing mission to provide you with exceptional heart care, we have created designated Provider Care Teams.  These Care Teams include your primary Cardiologist (physician) and Advanced Practice Providers (APPs -  Physician Assistants and Nurse Practitioners) who all work together to provide you with the care you need, when you need it.  We recommend signing up for the patient portal called "MyChart".  Sign up information is provided on this After Visit Summary.  MyChart is used to connect with patients for Virtual Visits (Telemedicine).  Patients are able to view lab/test results, encounter notes, upcoming appointments, etc.  Non-urgent messages can be sent to your provider as well.   To learn more about what you can do with MyChart, go to [email or web address].    Your next appointment:   2 week(s) post  CATH  The format for your next appointment:   In Person  Provider:   Lennie Odor, MD    Other Instructions  [facility] CARDIOVASCULAR DIVISION [facility] [address] [place] [address] Dept: [phone] Loc: [phone]  Ivelis Norgard  02/26/2022  You are scheduled  for a Cardiac Catheterization on Friday, July 21 with Dr. Christopher End.  1. Please arrive at the Main Entrance A at [facility]: [address] [place], [place] [address] at 5:30 AM (This time is two hours before your procedure to ensure your preparation). Free valet parking service is available.   Special note: Every effort is made to have your procedure done on time. Please understand that emergencies sometimes delay scheduled procedures.  2. Diet: Do not eat solid foods after midnight.  You may have clear liquids until 5 AM upon the day of the procedure.  3. Labs: You will need to have blood drawn today- CBC, BMET. You do not need to be fasting.  4. Medication instructions in preparation for your procedure:   Contrast Allergy: No  Take only 28 units of insulin the night before your procedure. Do not take any insulin on the day of the procedure.  Do not take Diabetes Med Glucophage (Metformin) on the day of the procedure and HOLD 48 HOURS AFTER THE PROCEDURE.  On the morning of your procedure, take Aspirin and any morning medicines NOT listed above.  You may use sips of water.  5. Plan to go home the same day, you will only stay overnight if medically necessary. 6. You MUST have a responsible adult to drive you home. 7. An adult MUST be with you the first 24 hours after you arrive home. 8. Bring a current list of your medications, and the last time and date medication taken. 9. Bring ID and current insurance cards. 10.Please wear clothes that are easy to get on and off and wear slip-on shoes.  Thank you for allowing us to care for  you!   -- [facility]          Time Spent with Patient: I have spent a total of 35 minutes with patient reviewing hospital notes, telemetry, EKGs, labs and examining the patient as well as establishing an assessment and plan that was discussed with the patient.  > 50% of time was spent in direct patient care.  Signed, [place] T. O'Neal, MD, FACC [place]  [facility]  [address], [address] [place], [place] [address] [phone]  02/26/2022 10:13 AM     

## 2022-02-25 NOTE — H&P (View-Only) (Signed)
Cardiology Office Note:   Date:  02/26/2022  NAME:  Olivia Garza    MRN: [id number] DOB:  [birth year]   PCP:  Eather Colas, FNP  Cardiologist:  None  Electrophysiologist:  None   Referring MD: Eather Colas, FNP   Chief Complaint  Patient presents with   Follow-up         History of Present Illness:   Olivia Garza is a 66 y.o. female with a hx of DM, HTN, HLD who presents for follow-up.  She is still getting chest discomfort.  Described as pressure and tightness in her chest.  Occurs randomly.  Not exertional.  Not alleviated by rest.  EKG is normal.  She is getting short of breath as well.  Heart catheterization recommended due to abnormal coronary CTA.  Concern for severe stenoses in the mid LAD, distal RCA and OM1.  She is okay to proceed with left heart catheterization.  I would like to definitively know what her coronary anatomy is.   Problem List DM -A1c 9.3 HLD -T chol 132, HDL 42, LDL 62, TG 159 HTN CAD -severe mid LAD 70-99% -severe distal RCA 70-99% -severe OM1  Past Medical History: Past Medical History:  Diagnosis Date   Diabetes mellitus without complication (HCC)    Hyperlipidemia    Hypertension     Past Surgical History: Past Surgical History:  Procedure Laterality Date   CARDIAC CATHETERIZATION      Current Medications: Current Meds  Medication Sig   aspirin EC 81 MG tablet Take 1 tablet (81 mg total) by mouth daily. Swallow whole.   empagliflozin (JARDIANCE) 25 MG TABS tablet Take by mouth.   Insulin Glargine (LANTUS [place]) Inject into the skin.   losartan (COZAAR) 25 MG tablet Take 1 tablet by mouth daily.   metformin (FORTAMET) 1000 MG (OSM) 24 hr tablet Take 1,000 mg by mouth 2 (two) times daily.   metoprolol tartrate (LOPRESSOR) 25 MG tablet Take 1 tablet (25 mg total) by mouth 2 (two) times daily.   naproxen (NAPROSYN) 500 MG tablet Take 1 tablet (500 mg total) by mouth 2 (two) times daily with a meal. (Patient taking differently:  Take 500 mg by mouth as needed.)   nitroGLYCERIN (NITROSTAT) 0.4 MG SL tablet Place 1 tablet (0.4 mg total) under the tongue every 5 (five) minutes as needed for chest pain.   rosuvastatin (CRESTOR) 20 MG tablet Take 1 tablet by mouth daily.     Allergies:    Atorvastatin   Social History: Social History   Socioeconomic History   Marital status: Married    Spouse name: Not on file   Number of children: 4   Years of education: Not on file   Highest education level: Not on file  Occupational History   Occupation: Book keeping  Tobacco Use   Smoking status: Former   Smokeless tobacco: Never  Building services engineer Use: Never used  Substance and Sexual Activity   Alcohol use: Yes    Alcohol/week: 3.0 standard drinks of alcohol    Types: 3 Standard drinks or equivalent per week   Drug use: Never   Sexual activity: Not on file  Other Topics Concern   Not on file  Social History Narrative   Not on file   Social Determinants of Health   Financial Resource Strain: Not on file  Food Insecurity: Not on file  Transportation Needs: Not on file  Physical Activity: Not on file  Stress: Not on file  Social Connections: Not on file     Family History: The patient's family history includes Cancer in her father and mother; Diabetes in her father; Heart disease in her father.  ROS:   All other ROS reviewed and negative. Pertinent positives noted in the HPI.     EKGs/Labs/Other Studies Reviewed:   The following studies were personally reviewed by me today:  EKG:  EKG is ordered today.  The ekg ordered today demonstrates normal sinus rhythm heart rate 72, no acute ischemic changes or evidence of infarction, and was personally reviewed by me.   Recent Labs: 01/18/2022: BUN 24; Creatinine, Ser 0.80; Hemoglobin 13.2; Platelets 237; Potassium 3.8; Sodium 141   Recent Lipid Panel No results found for: "CHOL", "TRIG", "HDL", "CHOLHDL", "VLDL", "LDLCALC", "LDLDIRECT"  Physical Exam:    VS:  BP 118/68   Pulse 72   Ht 5\' 2"  (1.575 m)   Wt 173 lb 12.8 oz (78.8 kg)   SpO2 93%   BMI 31.79 kg/m    Wt Readings from Last 3 Encounters:  02/26/22 173 lb 12.8 oz (78.8 kg)  01/25/22 174 lb 9.6 oz (79.2 kg)  01/18/22 172 lb (78 kg)    General: Well nourished, well developed, in no acute distress Head: Atraumatic, normal size  Eyes: PEERLA, EOMI  Neck: Supple, no JVD Endocrine: No thryomegaly Cardiac: Normal S1, S2; RRR; no murmurs, rubs, or gallops Lungs: Clear to auscultation bilaterally, no wheezing, rhonchi or rales  Abd: Soft, nontender, no hepatomegaly  Ext: No edema, pulses 2+ Musculoskeletal: No deformities, BUE and BLE strength normal and equal Skin: Warm and dry, no rashes   Neuro: Alert and oriented to person, place, time, and situation, CNII-XII grossly intact, no focal deficits  Psych: Normal mood and affect   ASSESSMENT:   Olivia Garza is a 66 y.o. female who presents for the following: 1. Coronary artery disease involving native coronary artery of native heart with unstable angina pectoris ([place])   2. Mixed hyperlipidemia     PLAN:   1. Coronary artery disease involving native coronary artery of native heart with unstable angina pectoris ([place]) 2. Mixed hyperlipidemia -Persistent chest pain symptoms.  Coronary CTA with concerning for severe stenoses in the mid LAD as well as distal RCA.  The OM branch also was concerning for severe stenosis.  FFR values are borderline.  Given concern for multivessel CAD and an OM branch that is too small for FFR analysis I recommended left heart catheterization with possible percutaneous coronary intervention.  She will continue aspirin and statin.  She is on metoprolol tartrate 25 mg twice daily.  She is still having symptoms.  Blood pressure is normal.  I fear she may be developing unstable angina.  Given her diabetes she is high risk for multivessel CAD.  We will plan for left heart catheterization on 03/08/2022.  She has  nitroglycerin.  Strict return precautions were given.  She understands how to use nitro.  If she has pain for 15 minutes despite 3 doses of nitroglycerin at 3 to 5 minutes per dose she will go to the hospital.  We will reduce her insulin to half the dose the night before.  Metformin will be held after heart catheterization.  Lab work today.  She will see me back in 2 weeks after heart catheterization.  She also needs an echo.  Shared Decision Making/Informed Consent The risks [stroke (1 in 1000), death (1 in 1000), kidney failure [usually temporary] (1 in 500), bleeding (1 in 200),  allergic reaction [possibly serious] (1 in 200)], benefits (diagnostic support and management of coronary artery disease) and alternatives of a cardiac catheterization were discussed in detail with Olivia Garza and she is willing to proceed.  Disposition: Return in about 2 weeks (around 03/12/2022).  Medication Adjustments/Labs and Tests Ordered: Current medicines are reviewed at length with the patient today.  Concerns regarding medicines are outlined above.  Orders Placed This Encounter  Procedures   CBC   Basic metabolic panel   EKG 12-Lead   ECHOCARDIOGRAM COMPLETE   No orders of the defined types were placed in this encounter.   Patient Instructions  Medication Instructions:  The current medical regimen is effective;  continue present plan and medications.  -review changes for heart cath  *If you need a refill on your cardiac medications before your next appointment, please call your pharmacy*   Lab Work: CBC, BMET today   If you have labs (blood work) drawn today and your tests are completely normal, you will receive your results only by: MyChart Message (if you have MyChart) OR A paper copy in the mail If you have any lab test that is abnormal or we need to change your treatment, we will call you to review the results.   Testing/Procedures:  Your physician has requested that you have a cardiac  catheterization. Cardiac catheterization is used to diagnose and/or treat various heart conditions. Doctors may recommend this procedure for a number of different reasons. The most common reason is to evaluate chest pain. Chest pain can be a symptom of coronary artery disease (CAD), and cardiac catheterization can show whether plaque is narrowing or blocking your heart's arteries. This procedure is also used to evaluate the valves, as well as measure the blood flow and oxygen levels in different parts of your heart. For further information please visit [email or web address] Please follow instruction sheet, as given.  Echocardiogram - Your physician has requested that you have an echocardiogram. Echocardiography is a painless test that uses sound waves to create images of your heart. It provides your doctor with information about the size and shape of your heart and how well your heart's chambers and valves are working. This procedure takes approximately one hour. There are no restrictions for this procedure.    Follow-Up: At [facility], you and your health needs are our priority.  As part of our continuing mission to provide you with exceptional heart care, we have created designated Provider Care Teams.  These Care Teams include your primary Cardiologist (physician) and Advanced Practice Providers (APPs -  Physician Assistants and Nurse Practitioners) who all work together to provide you with the care you need, when you need it.  We recommend signing up for the patient portal called "MyChart".  Sign up information is provided on this After Visit Summary.  MyChart is used to connect with patients for Virtual Visits (Telemedicine).  Patients are able to view lab/test results, encounter notes, upcoming appointments, etc.  Non-urgent messages can be sent to your provider as well.   To learn more about what you can do with MyChart, go to [email or web address].    Your next appointment:   2 week(s) post  CATH  The format for your next appointment:   In Person  Provider:   Lennie Odor, MD    Other Instructions  [facility] CARDIOVASCULAR DIVISION [facility] [address] [place] [address] Dept: [phone] Loc: [phone]  Olivia Garza  02/26/2022  You are scheduled  for a Cardiac Catheterization on Friday, July 21 with Dr. Harrell Gave End.  1. Please arrive at the Main Entrance A at [facility]: [place], [place] [address] at 5:30 AM (This time is two hours before your procedure to ensure your preparation). Free valet parking service is available.   Special note: Every effort is made to have your procedure done on time. Please understand that emergencies sometimes delay scheduled procedures.  2. Diet: Do not eat solid foods after midnight.  You may have clear liquids until 5 AM upon the day of the procedure.  3. Labs: You will need to have blood drawn today- CBC, BMET. You do not need to be fasting.  4. Medication instructions in preparation for your procedure:   Contrast Allergy: No  Take only 28 units of insulin the night before your procedure. Do not take any insulin on the day of the procedure.  Do not take Diabetes Med Glucophage (Metformin) on the day of the procedure and HOLD 48 HOURS AFTER THE PROCEDURE.  On the morning of your procedure, take Aspirin and any morning medicines NOT listed above.  You may use sips of water.  5. Plan to go home the same day, you will only stay overnight if medically necessary. 6. You MUST have a responsible adult to drive you home. 7. An adult MUST be with you the first 24 hours after you arrive home. 8. Bring a current list of your medications, and the last time and date medication taken. 9. Bring ID and current insurance cards. 10.Please wear clothes that are easy to get on and off and wear slip-on shoes.  Thank you for allowing [place] to care for  you!   -- [facility]          Time Spent with Patient: I have spent a total of 35 minutes with patient reviewing hospital notes, telemetry, EKGs, labs and examining the patient as well as establishing an assessment and plan that was discussed with the patient.  > 50% of time was spent in direct patient care.  Signed, Addison Naegeli. Audie Box, MD, [place]  [address], [place], [place] [address] [phone]  02/26/2022 10:13 AM

## 2022-02-26 ENCOUNTER — Ambulatory Visit (INDEPENDENT_AMBULATORY_CARE_PROVIDER_SITE_OTHER): Payer: Medicare HMO | Admitting: Cardiovascular Disease

## 2022-02-26 ENCOUNTER — Encounter: Payer: Self-pay | Admitting: Cardiovascular Disease

## 2022-02-26 VITALS — BP 118/68 | HR 72 | Ht 62.0 in | Wt 173.8 lb

## 2022-02-26 DIAGNOSIS — I25118 Atherosclerotic heart disease of native coronary artery with other forms of angina pectoris: Secondary | ICD-10-CM

## 2022-02-26 DIAGNOSIS — I25110 Atherosclerotic heart disease of native coronary artery with unstable angina pectoris: Secondary | ICD-10-CM

## 2022-02-26 DIAGNOSIS — E782 Mixed hyperlipidemia: Secondary | ICD-10-CM

## 2022-02-26 NOTE — Patient Instructions (Signed)
Medication Instructions:  The current medical regimen is effective;  continue present plan and medications.  -review changes for heart cath  *If you need a refill on your cardiac medications before your next appointment, please call your pharmacy*   Lab Work: CBC, BMET today   If you have labs (blood work) drawn today and your tests are completely normal, you will receive your results only by: MyChart Message (if you have MyChart) OR A paper copy in the mail If you have any lab test that is abnormal or we need to change your treatment, we will call you to review the results.   Testing/Procedures:  Your physician has requested that you have a cardiac catheterization. Cardiac catheterization is used to diagnose and/or treat various heart conditions. Doctors may recommend this procedure for a number of different reasons. The most common reason is to evaluate chest pain. Chest pain can be a symptom of coronary artery disease (CAD), and cardiac catheterization can show whether plaque is narrowing or blocking your heart's arteries. This procedure is also used to evaluate the valves, as well as measure the blood flow and oxygen levels in different parts of your heart. For further information please visit [email or web address] Please follow instruction sheet, as given.  Echocardiogram - Your physician has requested that you have an echocardiogram. Echocardiography is a painless test that uses sound waves to create images of your heart. It provides your doctor with information about the size and shape of your heart and how well your heart's chambers and valves are working. This procedure takes approximately one hour. There are no restrictions for this procedure.    Follow-Up: At [facility], you and your health needs are our priority.  As part of our continuing mission to provide you with exceptional heart care, we have created designated Provider Care Teams.  These Care Teams include your primary  Cardiologist (physician) and Advanced Practice Providers (APPs -  Physician Assistants and Nurse Practitioners) who all work together to provide you with the care you need, when you need it.  We recommend signing up for the patient portal called "MyChart".  Sign up information is provided on this After Visit Summary.  MyChart is used to connect with patients for Virtual Visits (Telemedicine).  Patients are able to view lab/test results, encounter notes, upcoming appointments, etc.  Non-urgent messages can be sent to your provider as well.   To learn more about what you can do with MyChart, go to [email or web address].    Your next appointment:   2 week(s) post CATH  The format for your next appointment:   In Person  Provider:   Lennie Odor, MD    Other Instructions  [facility] CARDIOVASCULAR DIVISION [facility] [address] [place] [address] [place] [address] Dept: [phone] Loc: [phone]  Olivia Garza  02/26/2022  You are scheduled for a Cardiac Catheterization on Friday, July 21 with Dr. [name] End.  1. Please arrive at the Main Entrance A at [facility]: [address] [place], [place] [address] at 5:30 AM (This time is two hours before your procedure to ensure your preparation). Free valet parking service is available.   Special note: Every effort is made to have your procedure done on time. Please understand that emergencies sometimes delay scheduled procedures.  2. Diet: Do not eat solid foods after midnight.  You may have clear liquids until 5 AM upon the day of the procedure.  3. Labs: You will need to have  blood drawn today- CBC, BMET. You do not need to be fasting.  4. Medication instructions in preparation for your procedure:   Contrast Allergy: No  Take only 28 units of insulin the night before your procedure. Do not take any insulin on the day of the procedure.  Do not take Diabetes Med Glucophage  (Metformin) on the day of the procedure and HOLD 48 HOURS AFTER THE PROCEDURE.  On the morning of your procedure, take Aspirin and any morning medicines NOT listed above.  You may use sips of water.  5. Plan to go home the same day, you will only stay overnight if medically necessary. 6. You MUST have a responsible adult to drive you home. 7. An adult MUST be with you the first 24 hours after you arrive home. 8. Bring a current list of your medications, and the last time and date medication taken. 9. Bring ID and current insurance cards. 10.Please wear clothes that are easy to get on and off and wear slip-on shoes.  Thank you for allowing [place] to care for you!   -- [facility]

## 2022-02-27 LAB — BASIC METABOLIC PANEL
BUN/Creatinine Ratio: 22 (ref 12–28)
BUN: 18 mg/dL (ref 8–27)
CO2: 24 mmol/L (ref 20–29)
Calcium: 10.3 mg/dL (ref 8.7–10.3)
Chloride: 104 mmol/L (ref 96–106)
Creatinine, Ser: 0.83 mg/dL (ref 0.57–1.00)
Glucose: 101 mg/dL — ABNORMAL HIGH (ref 70–99)
Potassium: 4.2 mmol/L (ref 3.5–5.2)
Sodium: 145 mmol/L — ABNORMAL HIGH (ref 134–144)
eGFR: 78 mL/min/{1.73_m2} (ref >59)

## 2022-02-27 LAB — CBC
Hematocrit: 41.6 % (ref 34.0–46.6)
Hemoglobin: 14.2 g/dL (ref 11.1–15.9)
MCH: 29 pg (ref 26.6–33.0)
MCHC: 34.1 g/dL (ref 31.5–35.7)
MCV: 85 fL (ref 79–97)
Platelets: 262 10*3/uL (ref 150–450)
RBC: 4.89 x10E6/uL (ref 3.77–5.28)
RDW: 13.5 % (ref 11.7–15.4)
WBC: 8.4 10*3/uL (ref 3.4–10.8)

## 2022-03-07 ENCOUNTER — Telehealth: Payer: Self-pay | Admitting: *Deleted

## 2022-03-07 NOTE — Telephone Encounter (Signed)
Cardiac Catheterization scheduled at [facility] for: Friday March 08, 2022 7:30 AM Arrival time and place: [facility] Main Entrance A at: 5:30 AM   Nothing to eat after midnight prior to procedure, clear liquids until 5 AM day of procedure.  Medication instructions: -Hold:  Metformin-day of procedure and 48 hours post procedure  Jardiance-AM of procedure  Insulin-1/2 usual dose HS prior to procedure -Except hold medications usual morning medications can be taken with sips of water including aspirin 81 mg.  Confirmed patient has responsible adult to drive home post procedure and be with patient first 24 hours after arriving home.  Patient reports no new symptoms concerning for COVID-19 in the past 10 days.  Reviewed procedure instructions with patient.

## 2022-03-08 ENCOUNTER — Other Ambulatory Visit (HOSPITAL_COMMUNITY): Payer: Self-pay

## 2022-03-08 ENCOUNTER — Ambulatory Visit (HOSPITAL_COMMUNITY)
Admission: RE | Admit: 2022-03-08 | Discharge: 2022-03-08 | Disposition: A | Discharge status: Home / Self Care | Payer: Medicare HMO | Attending: Internal Medicine | Admitting: Internal Medicine

## 2022-03-08 ENCOUNTER — Other Ambulatory Visit: Payer: Self-pay

## 2022-03-08 ENCOUNTER — Encounter (HOSPITAL_COMMUNITY)
Admission: RE | Disposition: A | Discharge status: Home / Self Care | Payer: Self-pay | Source: Home / Self Care | Attending: Internal Medicine

## 2022-03-08 DIAGNOSIS — Z79899 Other long term (current) drug therapy: Secondary | ICD-10-CM | POA: Insufficient documentation

## 2022-03-08 DIAGNOSIS — I2584 Coronary atherosclerosis due to calcified coronary lesion: Secondary | ICD-10-CM | POA: Insufficient documentation

## 2022-03-08 DIAGNOSIS — Z955 Presence of coronary angioplasty implant and graft: Secondary | ICD-10-CM | POA: Insufficient documentation

## 2022-03-08 DIAGNOSIS — Z7984 Long term (current) use of oral hypoglycemic drugs: Secondary | ICD-10-CM | POA: Diagnosis not present

## 2022-03-08 DIAGNOSIS — I10 Essential (primary) hypertension: Secondary | ICD-10-CM | POA: Diagnosis not present

## 2022-03-08 DIAGNOSIS — I25110 Atherosclerotic heart disease of native coronary artery with unstable angina pectoris: Secondary | ICD-10-CM | POA: Diagnosis present

## 2022-03-08 DIAGNOSIS — Z794 Long term (current) use of insulin: Secondary | ICD-10-CM | POA: Insufficient documentation

## 2022-03-08 DIAGNOSIS — Z95820 Peripheral vascular angioplasty status with implants and grafts: Secondary | ICD-10-CM

## 2022-03-08 DIAGNOSIS — E119 Type 2 diabetes mellitus without complications: Secondary | ICD-10-CM | POA: Insufficient documentation

## 2022-03-08 DIAGNOSIS — E782 Mixed hyperlipidemia: Secondary | ICD-10-CM | POA: Diagnosis not present

## 2022-03-08 DIAGNOSIS — Z7982 Long term (current) use of aspirin: Secondary | ICD-10-CM | POA: Diagnosis not present

## 2022-03-08 DIAGNOSIS — I200 Unstable angina: Secondary | ICD-10-CM

## 2022-03-08 HISTORY — PX: LEFT HEART CATH AND CORONARY ANGIOGRAPHY: CATH118249

## 2022-03-08 HISTORY — PX: CORONARY STENT INTERVENTION: CATH118234

## 2022-03-08 LAB — GLUCOSE, CAPILLARY: Glucose-Capillary: 203 mg/dL — ABNORMAL HIGH (ref 70–99)

## 2022-03-08 LAB — POCT ACTIVATED CLOTTING TIME
Activated Clotting Time: 275 seconds
Activated Clotting Time: 299 seconds

## 2022-03-08 SURGERY — LEFT HEART CATH AND CORONARY ANGIOGRAPHY
Anesthesia: LOCAL

## 2022-03-08 MED ORDER — VERAPAMIL HCL 2.5 MG/ML IV SOLN
INTRAVENOUS | Status: AC
  Filled 2022-03-08: qty 2

## 2022-03-08 MED ORDER — IOHEXOL 350 MG/ML SOLN
INTRAVENOUS | Status: DC | PRN
  Administered 2022-03-08: 95 mL via INTRA_ARTERIAL

## 2022-03-08 MED ORDER — LIDOCAINE HCL (PF) 1 % IJ SOLN
INTRAMUSCULAR | Status: AC
  Filled 2022-03-08: qty 30

## 2022-03-08 MED ORDER — HEPARIN SODIUM (PORCINE) 1000 UNIT/ML IJ SOLN
INTRAMUSCULAR | Status: AC
  Filled 2022-03-08: qty 10

## 2022-03-08 MED ORDER — VERAPAMIL HCL 2.5 MG/ML IV SOLN
INTRAVENOUS | Status: DC | PRN
  Administered 2022-03-08: 10 mL via INTRA_ARTERIAL

## 2022-03-08 MED ORDER — METFORMIN HCL ER (OSM) 1000 MG PO TB24: 1000.0000 mg | ORAL_TABLET | Freq: Two times a day (BID) | ORAL | Status: AC

## 2022-03-08 MED ORDER — SODIUM CHLORIDE 0.9% FLUSH: 3.0000 mL | INTRAVENOUS | Status: DC | PRN

## 2022-03-08 MED ORDER — SODIUM CHLORIDE 0.9 % WEIGHT BASED INFUSION
1.0000 mL/kg/h | INTRAVENOUS | Status: DC
Start: 2022-03-08 — End: 2022-03-08

## 2022-03-08 MED ORDER — FAMOTIDINE IN NACL 20-0.9 MG/50ML-% IV SOLN
INTRAVENOUS | Status: DC | PRN
  Administered 2022-03-08: 20 mg via INTRAVENOUS

## 2022-03-08 MED ORDER — LABETALOL HCL 5 MG/ML IV SOLN: 10.0000 mg | INTRAVENOUS | Status: DC | PRN

## 2022-03-08 MED ORDER — ASPIRIN 81 MG PO CHEW: 81.0000 mg | CHEWABLE_TABLET | ORAL | Status: AC

## 2022-03-08 MED ORDER — ACETAMINOPHEN 325 MG PO TABS: 650.0000 mg | ORAL_TABLET | ORAL | Status: DC | PRN

## 2022-03-08 MED ORDER — SODIUM CHLORIDE 0.9% FLUSH: 3.0000 mL | Freq: Two times a day (BID) | INTRAVENOUS | Status: DC

## 2022-03-08 MED ORDER — HEPARIN (PORCINE) IN NACL 1000-0.9 UT/500ML-% IV SOLN
INTRAVENOUS | Status: AC
  Filled 2022-03-08: qty 1000

## 2022-03-08 MED ORDER — MIDAZOLAM HCL 2 MG/2ML IJ SOLN
INTRAMUSCULAR | Status: DC | PRN
  Administered 2022-03-08: 1 mg via INTRAVENOUS

## 2022-03-08 MED ORDER — LIDOCAINE HCL (PF) 1 % IJ SOLN
INTRAMUSCULAR | Status: DC | PRN
  Administered 2022-03-08: 2 mL

## 2022-03-08 MED ORDER — CLOPIDOGREL BISULFATE 75 MG PO TABS
75.0000 mg | ORAL_TABLET | Freq: Every day | ORAL | 0 refills | Status: DC
  Filled 2022-03-08: qty 30, 30d supply, fill #0

## 2022-03-08 MED ORDER — MIDAZOLAM HCL 2 MG/2ML IJ SOLN
INTRAMUSCULAR | Status: AC
  Filled 2022-03-08: qty 2

## 2022-03-08 MED ORDER — SODIUM CHLORIDE 0.9 % WEIGHT BASED INFUSION
3.0000 mL/kg/h | INTRAVENOUS | Status: AC
  Administered 2022-03-08: 3 mL/kg/h via INTRAVENOUS

## 2022-03-08 MED ORDER — CLOPIDOGREL BISULFATE 300 MG PO TABS
ORAL_TABLET | ORAL | Status: AC
  Filled 2022-03-08: qty 2

## 2022-03-08 MED ORDER — CLOPIDOGREL BISULFATE 75 MG PO TABS
75.0000 mg | ORAL_TABLET | Freq: Every day | ORAL | Status: DC
Start: 2022-03-09 — End: 2022-03-08

## 2022-03-08 MED ORDER — NITROGLYCERIN 1 MG/10 ML FOR IR/CATH LAB
INTRA_ARTERIAL | Status: AC
  Filled 2022-03-08: qty 10

## 2022-03-08 MED ORDER — CLOPIDOGREL BISULFATE 300 MG PO TABS
ORAL_TABLET | ORAL | Status: DC | PRN
  Administered 2022-03-08: 600 mg via ORAL

## 2022-03-08 MED ORDER — FENTANYL CITRATE (PF) 100 MCG/2ML IJ SOLN
INTRAMUSCULAR | Status: AC
  Filled 2022-03-08: qty 2

## 2022-03-08 MED ORDER — FENTANYL CITRATE (PF) 100 MCG/2ML IJ SOLN
INTRAMUSCULAR | Status: DC | PRN
  Administered 2022-03-08: 25 ug via INTRAVENOUS

## 2022-03-08 MED ORDER — SODIUM CHLORIDE 0.9 % IV SOLN: 250.0000 mL | INTRAVENOUS | Status: DC | PRN

## 2022-03-08 MED ORDER — HEPARIN SODIUM (PORCINE) 1000 UNIT/ML IJ SOLN
INTRAMUSCULAR | Status: DC | PRN
  Administered 2022-03-08: 2000 [IU] via INTRAVENOUS
  Administered 2022-03-08 (×2): 4000 [IU] via INTRAVENOUS

## 2022-03-08 MED ORDER — ONDANSETRON HCL 4 MG/2ML IJ SOLN: 4.0000 mg | Freq: Four times a day (QID) | INTRAMUSCULAR | Status: DC | PRN

## 2022-03-08 MED ORDER — HEPARIN (PORCINE) IN NACL 1000-0.9 UT/500ML-% IV SOLN
INTRAVENOUS | Status: DC | PRN
  Administered 2022-03-08 (×2): 500 mL

## 2022-03-08 MED ORDER — SODIUM CHLORIDE 0.9 % IV SOLN
INTRAVENOUS | Status: AC
Start: 2022-03-08 — End: 2022-03-08

## 2022-03-08 MED ORDER — FAMOTIDINE IN NACL 20-0.9 MG/50ML-% IV SOLN
INTRAVENOUS | Status: AC
  Filled 2022-03-08: qty 50

## 2022-03-08 MED ORDER — SODIUM CHLORIDE 0.9 % IV SOLN
250.0000 mL | INTRAVENOUS | Status: DC | PRN
Start: 2022-03-08 — End: 2022-03-08

## 2022-03-08 MED ORDER — ASPIRIN 81 MG PO CHEW: 81.0000 mg | CHEWABLE_TABLET | Freq: Every day | ORAL | Status: DC

## 2022-03-08 MED ORDER — HYDRALAZINE HCL 20 MG/ML IJ SOLN: 10.0000 mg | INTRAMUSCULAR | Status: DC | PRN

## 2022-03-08 MED ORDER — NITROGLYCERIN 1 MG/10 ML FOR IR/CATH LAB
INTRA_ARTERIAL | Status: DC | PRN
  Administered 2022-03-08 (×2): 200 ug via INTRACORONARY

## 2022-03-08 MED ORDER — SODIUM CHLORIDE 0.9% FLUSH
3.0000 mL | Freq: Two times a day (BID) | INTRAVENOUS | Status: DC
Start: 2022-03-08 — End: 2022-03-08

## 2022-03-08 SURGICAL SUPPLY — 18 items
BALL SAPPHIRE NC24 2.25X12 (BALLOONS) ×2
BALLN SAPPHIRE 2.0X12 (BALLOONS) ×2
BALLOON SAPPHIRE 2.0X12 (BALLOONS) IMPLANT
BALLOON SAPPHIRE NC24 2.25X12 (BALLOONS) IMPLANT
BAND CMPR LRG ZPHR (HEMOSTASIS) ×1
BAND ZEPHYR COMPRESS 30 LONG (HEMOSTASIS) ×1 IMPLANT
CATH 5FR JL3.5 JR4 ANG PIG MP (CATHETERS) ×1 IMPLANT
CATH LAUNCHER 6FR EBU3.5 (CATHETERS) ×1 IMPLANT
GLIDESHEATH SLEND SS 6F .021 (SHEATH) ×1 IMPLANT
GUIDEWIRE INQWIRE 1.5J.035X260 (WIRE) IMPLANT
INQWIRE 1.5J .035X260CM (WIRE) ×2
KIT ENCORE 26 ADVANTAGE (KITS) ×1 IMPLANT
KIT HEART LEFT (KITS) ×2 IMPLANT
PACK CARDIAC CATHETERIZATION (CUSTOM PROCEDURE TRAY) ×2 IMPLANT
STENT ONYX FRONTIER 2.0X15 (Permanent Stent) ×1 IMPLANT
TRANSDUCER W/STOPCOCK (MISCELLANEOUS) ×2 IMPLANT
TUBING CIL FLEX 10 FLL-RA (TUBING) ×2 IMPLANT
WIRE RUNTHROUGH .014X180CM (WIRE) ×1 IMPLANT

## 2022-03-08 NOTE — Progress Notes (Signed)
BP-109/60, HR-70, Pulse ox-93%. Removed 3 ml air per protocol, bleeding noted, reinserted 2 ml to obtain hemostasis. 10 ml air remain in TR Band. Will attempt to remove air again in 30 minutes.

## 2022-03-08 NOTE — Brief Op Note (Addendum)
BRIEF CARDIAC CATHETERIZATION NOTE  DATE: 03/08/2022  TIME: 8:32 AM  PATIENT:  Olivia Garza  66 y.o. female  PRE-OPERATIVE DIAGNOSIS:  Unstable angina  POST-OPERATIVE DIAGNOSIS:  Same  PROCEDURE:  Procedure(s): LEFT HEART CATH AND CORONARY ANGIOGRAPHY (N/A) CORONARY STENT INTERVENTION (N/A)  SURGEON:  Surgeon(s) and Role:    * Dajon Rowe, MD - Primary  FINDINGS: Severe single-vessel coronary artery disease with 80-90% OM1 stenosis.  Mild-moderate LAD and RCA disease also noted. Normal LVEF and mildly elevated LVEDP. Successful PCI to OM1 using Onyx Frontier 2.0 x 15 mm DES (post-dilated to 2.3 mm) with 0% residual stenosis and TIMI-3 flow.  RECOMMENDATIONS: Medical management of LAD and RCA disease. DAPT with aspirin and clopidogrel for at least 6 months. Anticipate same day discharge if no post-PCI complications during 6 hour observation period.  Yvonne Kendall, MD [facility]

## 2022-03-08 NOTE — Progress Notes (Signed)
BP-122/62, pulse 71, Pulse ox, 94 on room air Reverse Barbeau test performed per protocol. After 2 minutes Type A. Removed 3 ml of air, no bleeding or hematoma.

## 2022-03-08 NOTE — Progress Notes (Signed)
Reverse Barbeau Test performed per protocol, Type A. VSS, see vitals flowsheet.

## 2022-03-08 NOTE — Progress Notes (Signed)
Reverse Barbeau Test performed per protocol, good pleth waveform.

## 2022-03-08 NOTE — Progress Notes (Signed)
CARDIAC REHAB PHASE I      Pt home education including risk factors, exercise guidelines, antiplatelet therapy, restrictions, heart healthy diabetic diet, and CRP2 completed.  Will place order for CRP2 at [facility]. Pt and husband stated understanding of education. All questions ands concerns addressed. D/C home today.   [id number]   Woodroe Chen, RN BSN 03/08/2022 2:38 PM

## 2022-03-08 NOTE — Interval H&P Note (Signed)
History and Physical Interval Note:  03/08/2022 7:24 AM  Olivia Garza  has presented today for surgery, with the diagnosis of unstable angina and abnormal coronary CTA.  The various methods of treatment have been discussed with the patient and family. After consideration of risks, benefits and other options for treatment, the patient has consented to  Procedure(s): LEFT HEART CATH AND CORONARY ANGIOGRAPHY (N/A) as a surgical intervention.  The patient's history has been reviewed, patient examined, no change in status, stable for surgery.  I have reviewed the patient's chart and labs.  Questions were answered to the patient's satisfaction.    Cath Lab Visit (complete for each Cath Lab visit)  Clinical Evaluation Leading to the Procedure:   ACS: No.  Non-ACS:    Anginal Classification: CCS IV  Anti-ischemic medical therapy: Minimal Therapy (1 class of medications)  Non-Invasive Test Results: Equivocal test results (Borderline significant multivessel CAD by coronary CTA)  Prior CABG: No previous CABG  Olivia Garza

## 2022-03-08 NOTE — Discharge Summary (Cosign Needed)
Discharge Summary for Same Day PCI   Patient ID: Shenetha Gellner MRN: [id number]; DOB: [birth year]  Admit date: 03/08/2022 Discharge date: 03/08/2022  Primary Care Provider: Deborah Chalk, FNP  Primary Cardiologist: Evalina Field, MD  Primary Electrophysiologist:  None   Discharge Diagnoses    Active Problems:   Unstable angina [facility])   Diagnostic Studies/Procedures    Cardiac Catheterization 03/08/2022:  SURGEON:  Surgeon(s) and Role:    * Quinlin Conant, Harrell Gave, MD - Primary   FINDINGS: Severe single-vessel coronary artery disease with 80-90% OM1 stenosis.  Mild-moderate LAD and RCA disease also noted. Normal LVEF and mildly elevated LVEDP. Successful PCI to OM1 using Onyx Frontier 2.0 x 15 mm DES (post-dilated to 2.3 mm) with 0% residual stenosis and TIMI-3 flow.   RECOMMENDATIONS: Medical management of LAD and RCA disease. DAPT with aspirin and clopidogrel for at least 6 months. Anticipate same day discharge if no post-PCI complications during 6 hour observation period.   Nelva Bush, MD [facility] _____________   History of Present Illness     Zamari Lupien is a 66 y.o. female with past medical history of diabetes, hypertension and hyperlipidemia who was seen as an outpatient by Dr. Audie Box.  Underwent outpatient coronary CTA which was concerning for severe stenosis in the mid LAD, distal RCA and OM1.  She was seen back in the office on 7/11 with recommendations to undergo outpatient cardiac catheterization.  Cardiac catheterization was arranged for further evaluation.  Hospital Course     The patient underwent cardiac cath as noted above with single vessel CAD with 80 to 90% OM1 stenosis treated with PCI/DES x1.  Moderate LAD and RCA disease to be treated medically.. Plan for DAPT with ASA/Plavix for at least 6 months. The patient was seen by cardiac rehab while in short stay. There were no observed complications post cath. Radial cath site was re-evaluated  prior to discharge and found to be stable without any complications. Instructions/precautions regarding cath site care were given prior to discharge.   Lucillie Garfinkel was seen by Dr. Saunders Revel and determined stable for discharge home. Follow up with our office has been arranged. Medications are listed below. Pertinent changes include Plavix.  _____________  Cath/PCI Registry Performance & Quality Measures: Aspirin prescribed? - Yes ADP Receptor Inhibitor (Plavix/Clopidogrel, Brilinta/Ticagrelor or Effient/Prasugrel) prescribed (includes medically managed patients)? - Yes High Intensity Statin (Lipitor 40-80mg  or Crestor 20-40mg ) prescribed? - Yes For EF <40%, was ACEI/ARB prescribed? - Not Applicable (EF >/= [id number]) For EF <40%, Aldosterone Antagonist (Spironolactone or Eplerenone) prescribed? - Not Applicable (EF >/= [id number]) Cardiac Rehab Phase II ordered (Included Medically managed Patients)? - Yes  _____________   Discharge Vitals Blood pressure 127/64, pulse 75, temperature 97.8 F (36.6 C), temperature source Temporal, resp. rate 18, height 5\' 2"  (1.575 m), weight 77.6 kg, SpO2 96 %.  Filed Weights   03/08/22 0526  Weight: 77.6 kg    Last Labs & Radiologic Studies    CBC No results for input(s): "WBC", "NEUTROABS", "HGB", "HCT", "MCV", "PLT" in the last 72 hours. Basic Metabolic Panel No results for input(s): "NA", "K", "CL", "CO2", "GLUCOSE", "BUN", "CREATININE", "CALCIUM", "MG", "PHOS" in the last 72 hours. Liver Function Tests No results for input(s): "AST", "ALT", "ALKPHOS", "BILITOT", "PROT", "ALBUMIN" in the last 72 hours. No results for input(s): "LIPASE", "AMYLASE" in the last 72 hours. High Sensitivity Troponin:   No results for input(s): "TROPONINIHS" in the last 720 hours.  BNP Invalid input(s): "POCBNP" D-Dimer No results for input(s): "DDIMER"  in the last 72 hours. Hemoglobin A1C No results for input(s): "HGBA1C" in the last 72 hours. Fasting Lipid Panel No results  for input(s): "CHOL", "HDL", "LDLCALC", "TRIG", "CHOLHDL", "LDLDIRECT" in the last 72 hours. Thyroid Function Tests No results for input(s): "TSH", "T4TOTAL", "T3FREE", "THYROIDAB" in the last 72 hours.  Invalid input(s): "FREET3" _____________  CT CORONARY FRACTIONAL FLOW RESERVE DATA PREP  Result Date: 02/20/2022 EXAM: CT FFR ANALYSIS CLINICAL DATA:  abnormal imaging of heart FINDINGS: FFRct analysis was performed on the original cardiac CT angiogram dataset. Diagrammatic representation of the FFRct analysis is provided in a separate PDF document in PACS. This dictation was created using the PDF document and an interactive 3D model of the results. 3D model is not available in the EMR/PACS. Normal FFR range is >0.80. 1. Left Main:  No significant stenosis. FFR = 1.00 2. LAD: Borderline stenosis. Proximal FFR = 0.98, Mid FFR = 0.80, Distal FFR cannot be calculated given small caliber of distal vessel. 3. LCX: No significant stenosis. Very short course, appears to trifurcate into small caliber vessels which cannot be analyzed. 4. RCA: Borderline stenosis at takeoff of PLB. Proximal FFR = 0.99, Mid FFR = 0.95. PDA = 0.87, PLB 0.79 IMPRESSION: 1. CT FFR analysis shows borderline significant stenosis in mid LAD (0.80) and distal RCA/ostial PLB (0.79). Electronically Signed   By: Jodelle Red M.D.   On: 02/20/2022 16:10   CT CORONARY MORPH W/CTA COR W/SCORE W/CA W/CM &/OR WO/CM  Addendum Date: 02/20/2022   ADDENDUM REPORT: 02/20/2022 10:40 EXAM: OVER-READ INTERPRETATION  CT CHEST The following report is an over-read performed by radiologist Dr. Alver Fisher [facility] on 02/20/2022. This over-read does not include interpretation of cardiac or coronary anatomy or pathology. The interpretation by the cardiologist is attached. COMPARISON:  None. FINDINGS: Tiny hiatal hernia.  Minimal scarring in the right middle lobe. IMPRESSION: No acute extracardiac findings. Electronically Signed   By:  Leanna Battles M.D.   On: 02/20/2022 10:40   Result Date: 02/20/2022 HISTORY: Chest pain/anginal equiv, intermediate CAD risk, not treadmill candidate EXAM: Cardiac/Coronary CT TECHNIQUE: The patient was scanned on a [employer]. PROTOCOL: A 120 kV prospective scan was triggered in the descending thoracic aorta at 111 HU's. Axial non-contrast 3 mm slices were carried out through the heart. The data set was analyzed on a dedicated work station and scored using the Agatston method. Gantry rotation speed was 250 msecs and collimation was .6 mm. Heart rate was optimized medically and sl NTG was given. The 3D data set was reconstructed in 5% intervals of the 35-75 % of the R-R cycle. Systolic and diastolic phases were analyzed on a dedicated work station using MPR, MIP and VRT modes. The patient received OMNIPAQUE IOHEXOL 350 MG/ML SOLN of contrast. FINDINGS: Coronary calcium score: The patient's coronary artery calcium score is 483, which places the patient in the 95th percentile. Coronary arteries: Normal coronary origins.  Right dominance. Right Coronary Artery: Normal caliber vessel, gives rise to PDA and PLB. Predominantly noncalcified plaque in proximal RCA with 1-24% stenosis. Mixed calcified and noncalcified plaque in mid and distal RCA, with focal stenosis in distal RCA of 70-99%. Left Main Coronary Artery: Normal caliber vessel. Scattered noncalcified plaque with 1-24% stenosis. Left Anterior Descending Coronary Artery: Normal caliber vessel tapering to small caliber in mid to distal portion. Diffuse mixed calcified and noncalcified plaque throughout proximal and mid LAD. Maximum stenosis 70-99% in mid LAD. Gives rise to three small diagonal branches. Left  Circumflex Artery: Small caliber vessel, with abrupt Genene Kilman in proximal portion, cannot exclude chronic total occlusion vs trifurcation into very small caliber vessels. Given nondominance and vessel size, tapering to small caliber vessels  favored. Aorta: Normal size, 36 mm at the mid ascending aorta (level of the PA bifurcation) measured double oblique. Aortic atherosclerosis. No dissection seen in visualized portions of the aorta. Aortic Valve: No calcifications. Trileaflet. Other findings: Normal pulmonary vein drainage into the left atrium. Normal left atrial appendage without a thrombus. Normal size of the pulmonary artery. Normal appearance of the pericardium. Possible small PFO without clear evidence of shunting. There is a likely small muscular VSD, see images. Flow not clearly visualized between chamber. IMPRESSION: 1. At least severe CAD, CADRADS = 4. CT FFR will be performed and reported separately. Distal RCA with 70-99% stenosis, mid LAD with 70-99% stenosis, proximal LCx with CTO vs. Tapering into small vessels (see below). 2. Coronary calcium score of 483. This was 95th percentile for age and sex matched control. 3. Normal coronary origin with right dominance. Left circumflex is small caliber vessel, with abrupt Datron Brakebill in proximal portion, cannot exclude chronic total occlusion vs trifurcation into very small caliber vessels. Given nondominance and vessel size, tapering to small caliber vessels favored. 4. Aortic atherosclerosis. 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 INTERPRETATION: CAD-RADS 4: Severe stenosis. (70-99% or > 50% left main). Cardiac catheterization or CT FFR is recommended. Consider symptom-guided anti-ischemic pharmacotherapy as well as risk factor modification per guideline directed care. Invasive coronary angiography recommended with revascularization per published guideline statements. Electronically Signed: By: Buford Dresser M.D. On: 02/20/2022 08:39    Disposition   Pt is being discharged home today in good condition.  Follow-up Plans & Appointments     Follow-up Information     O'Neal, Cassie Freer, MD Follow up on 03/21/2022.   Specialties: Cardiology, Internal Medicine, Radiology Why: at 3:30pm for your  follow up appt. Contact information: [place] [address] [phone]                Discharge Instructions     AMB Referral to Cardiac Rehabilitation - Phase II   Complete by: As directed    Diagnosis: Coronary Stents   After initial evaluation and assessments completed: Virtual Based Care may be provided alone or in conjunction with Phase 2 Cardiac Rehab based on patient barriers.: Yes        Discharge Medications   Allergies as of 03/08/2022       Reactions   Atorvastatin    Myalgias        Medication List     STOP taking these medications    naproxen 500 MG tablet Commonly known as: Naprosyn       TAKE these medications    aspirin EC 81 MG tablet Take 1 tablet (81 mg total) by mouth daily. Swallow whole.   CALCIUM/VITAMIN D3 GUMMIES PO Take 3 each by mouth daily.   clopidogrel 75 MG tablet Commonly known as: Plavix Take 1 tablet (75 mg total) by mouth daily.   diclofenac Sodium 1 % Gel Commonly known as: VOLTAREN Apply 1 Application topically 2 (two) times daily as needed (knee pain).   empagliflozin 25 MG Tabs tablet Commonly known as: JARDIANCE Take 25 mg by mouth daily.   Lantus SoloStar 100 UNIT/ML Solostar Pen Generic drug: insulin glargine Inject 56 Units into the skin at bedtime.   losartan 25 MG tablet Commonly known as: COZAAR Take 25 mg by mouth every  evening.   melatonin 5 MG Tabs Take 10 mg by mouth at bedtime as needed (sleep).   metformin 1000 MG (OSM) 24 hr tablet Commonly known as: FORTAMET Take 1 tablet (1,000 mg total) by mouth 2 (two) times daily. Start taking on: March 11, 2022 What changed: These instructions start on March 11, 2022. If you are unsure what to do until then, ask your doctor or other care provider.   metoprolol tartrate 25 MG tablet Commonly known as: LOPRESSOR Take 1 tablet (25 mg total) by mouth 2 (two) times daily.   nitroGLYCERIN 0.4 MG SL tablet Commonly known as:  NITROSTAT Place 1 tablet (0.4 mg total) under the tongue every 5 (five) minutes as needed for chest pain.   rosuvastatin 20 MG tablet Commonly known as: CRESTOR Take 20 mg by mouth every evening.        Allergies Allergies  Allergen Reactions   Atorvastatin     Myalgias    Outstanding Labs/Studies   N/a   Duration of Discharge Encounter   Greater than 30 minutes including physician time.  Signed, Laverda Page, NP 03/08/2022, 2:36 PM   I have independently seen and examined the patient and agree with the findings and plan, as documented in Ms. Su Hilt note.  Ms. Leighton Ruff feels well status post PCI to OM1.  Radial band has been removed with right wrist showing absence of hematoma.  Patient is stable for same-day PCI discharge.  She has been evaluated by cardiac rehab and also received 30-day supply of clopidogrel.  She will need to remain on dual antiplatelet therapy with aspirin and clopidogrel for at least 6 months.  I will send a long-term prescription to her outpatient pharmacy as well.  She is scheduled for follow-up with Dr. Flora Lipps on 03/21/2022.  She can proceed with outpatient echo that is also scheduled for next week.  Yvonne Kendall, MD [facility]

## 2022-03-08 NOTE — Progress Notes (Signed)
Reverse Barbeau Test performed per protocol, Type A. VSS, see vitals flowsheet. 3 more ml air removed per protocol. No new bleeding noted.

## 2022-03-09 ENCOUNTER — Other Ambulatory Visit: Payer: Self-pay | Admitting: Internal Medicine

## 2022-03-09 MED ORDER — CLOPIDOGREL BISULFATE 75 MG PO TABS: 75.0000 mg | ORAL_TABLET | Freq: Every day | ORAL | 1 refills | Status: DC

## 2022-03-11 ENCOUNTER — Encounter (HOSPITAL_COMMUNITY): Payer: Self-pay | Admitting: Internal Medicine

## 2022-03-12 ENCOUNTER — Ambulatory Visit (HOSPITAL_COMMUNITY): Payer: Medicare HMO | Attending: Internal Medicine

## 2022-03-12 DIAGNOSIS — I25110 Atherosclerotic heart disease of native coronary artery with unstable angina pectoris: Secondary | ICD-10-CM | POA: Insufficient documentation

## 2022-03-12 LAB — ECHOCARDIOGRAM COMPLETE
Area-P 1/2: 2.61 cm2
P 1/2 time: 473 msec
S' Lateral: 1.9 cm

## 2022-03-21 ENCOUNTER — Encounter: Payer: Self-pay | Admitting: Cardiovascular Disease

## 2022-03-21 ENCOUNTER — Ambulatory Visit: Payer: Medicare HMO | Admitting: Cardiovascular Disease

## 2022-03-21 VITALS — BP 124/58 | HR 75 | Ht 62.0 in | Wt 172.0 lb

## 2022-03-21 DIAGNOSIS — E782 Mixed hyperlipidemia: Secondary | ICD-10-CM | POA: Diagnosis not present

## 2022-03-21 DIAGNOSIS — Z95820 Peripheral vascular angioplasty status with implants and grafts: Secondary | ICD-10-CM | POA: Diagnosis not present

## 2022-03-21 DIAGNOSIS — I2510 Atherosclerotic heart disease of native coronary artery without angina pectoris: Secondary | ICD-10-CM | POA: Diagnosis not present

## 2022-03-21 MED ORDER — CLOPIDOGREL BISULFATE 75 MG PO TABS: 75.0000 mg | ORAL_TABLET | Freq: Every day | ORAL | 2 refills | Status: DC

## 2022-03-21 NOTE — Patient Instructions (Signed)
Medication Instructions:  ?The current medical regimen is effective;  continue present plan and medications. ? ?*If you need a refill on your cardiac medications before your next appointment, please call your pharmacy* ? ? ?Follow-Up: ?At [facility], you and your health needs are our priority.  As part of our continuing mission to provide you with exceptional heart care, we have created designated Provider Care Teams.  These Care Teams include your primary Cardiologist (physician) and Advanced Practice Providers (APPs -  Physician Assistants and Nurse Practitioners) who all work together to provide you with the care you need, when you need it. ? ?We recommend signing up for the patient portal called "MyChart".  Sign up information is provided on this After Visit Summary.  MyChart is used to connect with patients for Virtual Visits (Telemedicine).  Patients are able to view lab/test results, encounter notes, upcoming appointments, etc.  Non-urgent messages can be sent to your provider as well.   ?To learn more about what you can do with MyChart, go to [email or web address].   ? ?Your next appointment:   ?6 month(s) ? ?The format for your next appointment:   ?In Person ? ?Provider:   ?[place] T O'Neal, MD { ? ? ? ? ? ? ? ?

## 2022-03-21 NOTE — Progress Notes (Signed)
Cardiology Office Note:   Date:  03/21/2022  NAME:  Olivia Garza    MRN: [id number] DOB:  04/15/1956   PCP:  Eather Colas, FNP  Cardiologist:  Reatha Harps, MD  Electrophysiologist:  None   Referring MD: Eather Colas, FNP   Chief Complaint  Patient presents with   Follow-up   History of Present Illness:   Olivia Garza is a 66 y.o. female with a hx of CAD s/p PCI, HTN, HLD, DM who presents for follow-up.  She is doing well.  Underwent PCI to an OM branch.  Reports no chest pain issues.  Right radial cath site is clean and dry.  EKG is normal.  She is going to work on her diabetes.  She will be on aspirin Plavix for 6 months.  Her blood pressure is well controlled.  We discussed a proper exercise regimen as well as working on her diet.  She overall is doing quite well after her PCI.  Problem List DM -A1c 9.3 HLD -T chol 132, HDL 42, LDL 62, TG 159 HTN CAD -PCI OM1 02/2022 -50% pLAD -55% dRCA -Calcium score 483, 95th percentile  Past Medical History: Past Medical History:  Diagnosis Date   Diabetes mellitus without complication (HCC)    Hyperlipidemia    Hypertension     Past Surgical History: Past Surgical History:  Procedure Laterality Date   CARDIAC CATHETERIZATION     CORONARY STENT INTERVENTION N/A 03/08/2022   Procedure: CORONARY STENT INTERVENTION;  Surgeon: Yvonne Kendall, MD;  Location: [facility];  Service: Cardiovascular;  Laterality: N/A;   LEFT HEART CATH AND CORONARY ANGIOGRAPHY N/A 03/08/2022   Procedure: LEFT HEART CATH AND CORONARY ANGIOGRAPHY;  Surgeon: Yvonne Kendall, MD;  Location: [facility];  Service: Cardiovascular;  Laterality: N/A;    Current Medications: Current Meds  Medication Sig   aspirin EC 81 MG tablet Take 1 tablet (81 mg total) by mouth daily. Swallow whole.   Ca Phosphate-Cholecalciferol (CALCIUM/VITAMIN D3 GUMMIES PO) Take 3 each by mouth daily.   diclofenac Sodium (VOLTAREN) 1 % GEL Apply 1  Application topically 2 (two) times daily as needed (knee pain).   empagliflozin (JARDIANCE) 25 MG TABS tablet Take 25 mg by mouth daily.   insulin glargine (LANTUS SOLOSTAR) 100 UNIT/ML Solostar Pen Inject 56 Units into the skin at bedtime.   losartan (COZAAR) 25 MG tablet Take 25 mg by mouth every evening.   melatonin 5 MG TABS Take 10 mg by mouth at bedtime as needed (sleep).   metformin (FORTAMET) 1000 MG (OSM) 24 hr tablet Take 1 tablet (1,000 mg total) by mouth 2 (two) times daily.   metoprolol tartrate (LOPRESSOR) 25 MG tablet Take 1 tablet (25 mg total) by mouth 2 (two) times daily.   nitroGLYCERIN (NITROSTAT) 0.4 MG SL tablet Place 1 tablet (0.4 mg total) under the tongue every 5 (five) minutes as needed for chest pain.   rosuvastatin (CRESTOR) 20 MG tablet Take 20 mg by mouth every evening.   [DISCONTINUED] clopidogrel (PLAVIX) 75 MG tablet Take 1 tablet (75 mg total) by mouth daily.     Allergies:    Atorvastatin   Social History: Social History   Socioeconomic History   Marital status: Married    Spouse name: Not on file   Number of children: 4   Years of education: Not on file   Highest education level: Not on file  Occupational History   Occupation: Book keeping  Tobacco Use  Smoking status: Former   Smokeless tobacco: Never  Vaping Use   Vaping Use: Never used  Substance and Sexual Activity   Alcohol use: Yes    Alcohol/week: 3.0 standard drinks of alcohol    Types: 3 Standard drinks or equivalent per week   Drug use: Never   Sexual activity: Not on file  Other Topics Concern   Not on file  Social History Narrative   Not on file   Social Determinants of Health   Financial Resource Strain: Not on file  Food Insecurity: Not on file  Transportation Needs: Not on file  Physical Activity: Not on file  Stress: Not on file  Social Connections: Not on file     Family History: The patient's family history includes Cancer in her father and mother; Diabetes  in her father; Heart disease in her father.  ROS:   All other ROS reviewed and negative. Pertinent positives noted in the HPI.     EKGs/Labs/Other Studies Reviewed:   The following studies were personally reviewed by me today:  EKG:  EKG is ordered today.  The ekg ordered today demonstrates normal sinus rhythm heart rate 75, no acute ischemic changes or evidence of infarction, and was personally reviewed by me.   TTE 03/12/2022  1. Left ventricular ejection fraction, by estimation, is 60 to 65%. Left  ventricular ejection fraction by 3D volume is 66 %. The left ventricle has  normal function. The left ventricle has no regional wall motion  abnormalities. Left ventricular diastolic   parameters are consistent with Grade I diastolic dysfunction (impaired  relaxation).   2. Right ventricular systolic function is normal. The right ventricular  size is normal. Tricuspid regurgitation signal is inadequate for assessing  PA pressure.   3. Left atrial size was mildly dilated.   4. The mitral valve is normal in structure. Trivial mitral valve  regurgitation. No evidence of mitral stenosis.   5. The aortic valve is tricuspid. Aortic valve regurgitation is trivial.  No aortic stenosis is present.   6. The inferior vena cava is normal in size with greater than 50%  respiratory variability, suggesting right atrial pressure of 3 mmHg.   LHC 03/08/2022 Conclusions: Severe single-vessel coronary artery disease with 80-90% OM1 stenosis.  Mild-moderate LAD and RCA disease also noted. Normal left ventricular systolic function (LVEF >65%) and mildly elevated left ventricular filling pressure (LVEDP 18 mmHg). Successful PCI to OM1 using Onyx Frontier 2.0 x 15 mm drug-eluting stent (post-dilated to 2.3 mm) with 0% residual stenosis and TIMI-3 flow.  Recent Labs: 02/26/2022: BUN 18; Creatinine, Ser 0.83; Hemoglobin 14.2; Platelets 262; Potassium 4.2; Sodium 145   Recent Lipid Panel No results found for:  "CHOL", "TRIG", "HDL", "CHOLHDL", "VLDL", "LDLCALC", "LDLDIRECT"  Physical Exam:   VS:  BP (!) 124/58 (BP Location: Left Arm, Patient Position: Sitting, Cuff Size: Normal)   Pulse 75   Ht 5\' 2"  (1.575 m)   Wt 172 lb (78 kg)   BMI 31.46 kg/m    Wt Readings from Last 3 Encounters:  03/21/22 172 lb (78 kg)  03/08/22 171 lb (77.6 kg)  02/26/22 173 lb 12.8 oz (78.8 kg)    General: Well nourished, well developed, in no acute distress Head: Atraumatic, normal size  Eyes: PEERLA, EOMI  Neck: Supple, no JVD Endocrine: No thryomegaly Cardiac: Normal S1, S2; RRR; no murmurs, rubs, or gallops Lungs: Clear to auscultation bilaterally, no wheezing, rhonchi or rales  Abd: Soft, nontender, no hepatomegaly  Ext: No edema,  pulses 2+ Musculoskeletal: No deformities, BUE and BLE strength normal and equal Skin: Warm and dry, no rashes   Neuro: Alert and oriented to person, place, time, and situation, CNII-XII grossly intact, no focal deficits  Psych: Normal mood and affect   ASSESSMENT:   Twilia Yaklin is a 66 y.o. female who presents for the following: 1. Coronary artery disease involving native coronary artery of native heart without angina pectoris   2. Mixed hyperlipidemia   3. S/P percutaneous transluminal angioplasty (PTA) with stent placement     PLAN:   1. Coronary artery disease involving native coronary artery of native heart without angina pectoris 2. Mixed hyperlipidemia 3. S/P percutaneous transluminal angioplasty (PTA) with stent placement -Status post PCI to an OM branch.  She is doing well.  She will continue aspirin and Plavix for 6 months.  She is currently on Crestor 20 mg daily.  LDL cholesterol at goal.  Blood pressure is well controlled.  She will work on her diabetes.  She is on Jardiance which has heart protection.  She needs to get her A1c down.  If she continues to have symptoms of chest discomfort would recommend a cardiac PET scan to exclude microvascular dysfunction.   She overall seems to be motivated to improve her diet.  She will also start cardiac rehab soon.  We also discussed regular exercise and improving her diet.  Her EKG is normal today.  She will see me back in 6 months.  Disposition: Return in about 6 months (around 09/21/2022).  Medication Adjustments/Labs and Tests Ordered: Current medicines are reviewed at length with the patient today.  Concerns regarding medicines are outlined above.  Orders Placed This Encounter  Procedures   EKG 12-Lead   Meds ordered this encounter  Medications   clopidogrel (PLAVIX) 75 MG tablet    Sig: Take 1 tablet (75 mg total) by mouth daily.    Dispense:  90 tablet    Refill:  2    Patient Instructions  Medication Instructions:  The current medical regimen is effective;  continue present plan and medications.  *If you need a refill on your cardiac medications before your next appointment, please call your pharmacy*   Follow-Up: At [facility], you and your health needs are our priority.  As part of our continuing mission to provide you with exceptional heart care, we have created designated Provider Care Teams.  These Care Teams include your primary Cardiologist (physician) and Advanced Practice Providers (APPs -  Physician Assistants and Nurse Practitioners) who all work together to provide you with the care you need, when you need it.  We recommend signing up for the patient portal called "MyChart".  Sign up information is provided on this After Visit Summary.  MyChart is used to connect with patients for Virtual Visits (Telemedicine).  Patients are able to view lab/test results, encounter notes, upcoming appointments, etc.  Non-urgent messages can be sent to your provider as well.   To learn more about what you can do with MyChart, go to [email or web address].    Your next appointment:   6 month(s)  The format for your next appointment:   In Person  Provider:   Reatha Harps, MD               Time Spent with Patient: I have spent a total of 35 minutes with patient reviewing hospital notes, telemetry, EKGs, labs and examining the patient as well as establishing an assessment and plan that was discussed with  the patient.  > 50% of time was spent in direct patient care.  Signed, Lenna Gilford. Flora Lipps, MD, [facility]  [facility]  [address], [address] [place], [place] [address] [phone]  03/21/2022 3:50 PM

## 2022-03-26 ENCOUNTER — Telehealth (HOSPITAL_COMMUNITY): Payer: Self-pay

## 2022-03-26 NOTE — Telephone Encounter (Signed)
Pt is unable to participate in the cardiac rehab, because of her work schedule. Closed referral.

## 2022-03-26 NOTE — Telephone Encounter (Signed)
Pt will not be able to do the cardiac rehab program because of her work schedule. Closed referral.

## 2022-08-16 IMAGING — CT CT ABD-PELV W/ CM
2 of 5 series · 16 of 46 positions shown, 18 images · IV contrast (Omnipaque)
Comparison: August 28, 2020

CLINICAL DATA: Right lower quadrant pain x1 week.

EXAM:
CT ABDOMEN AND PELVIS WITH CONTRAST
TECHNIQUE: Multidetector CT imaging of the abdomen and pelvis was performed
using the standard protocol following bolus administration of
intravenous contrast.
CONTRAST:  100mL OMNIPAQUE IOHEXOL 300 MG/ML  SOLN

[Series 2: axial st · axial · 0.82mm/px · z∈[-460,-50]mm · 13 of 92 slices shown, 15 images]
[im 5/92  soft-tissue]
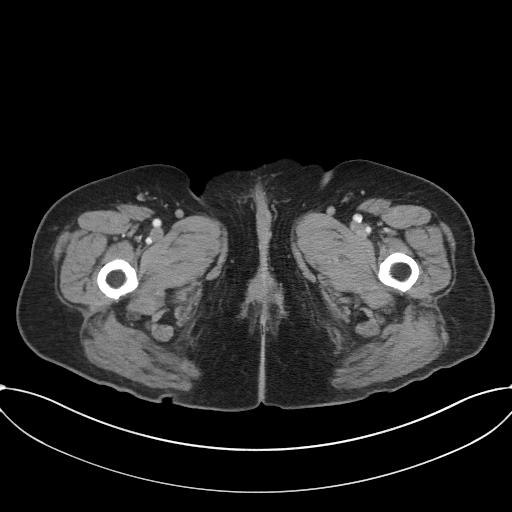
[im 5/92  bone]
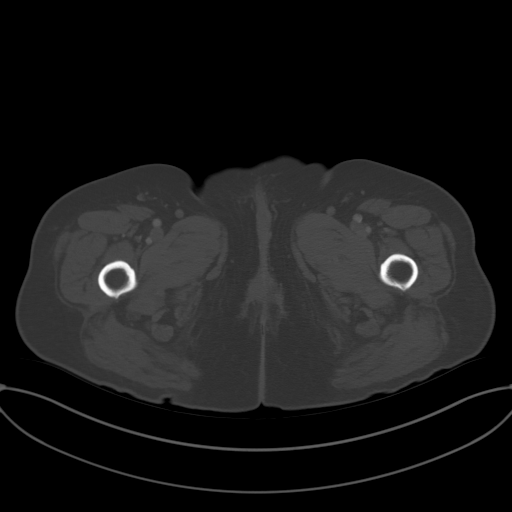
[im 15/92  soft-tissue]
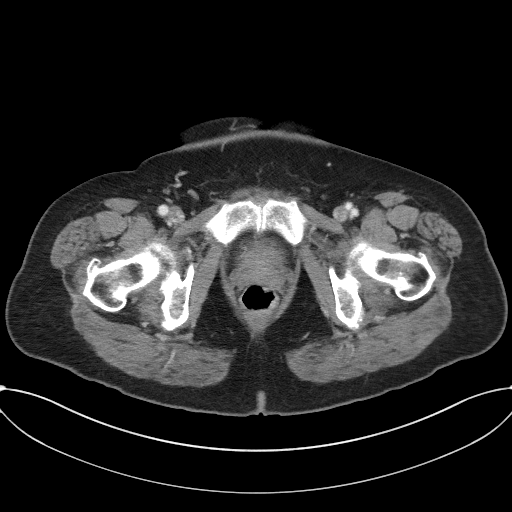
[im 20/92  soft-tissue]
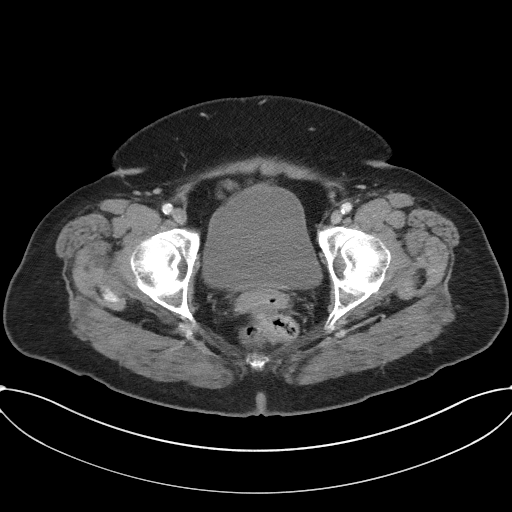
[im 24/92  soft-tissue]
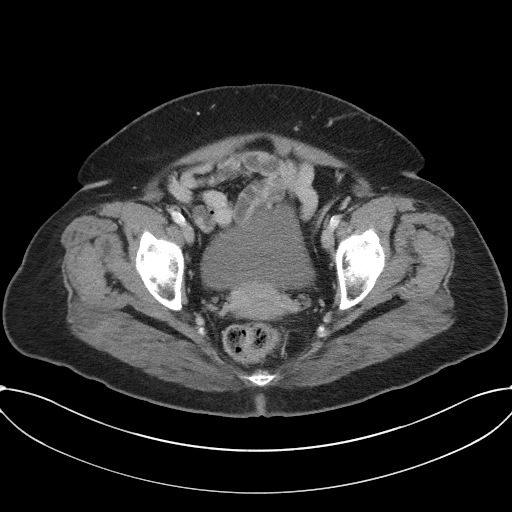
[im 34/92  soft-tissue]
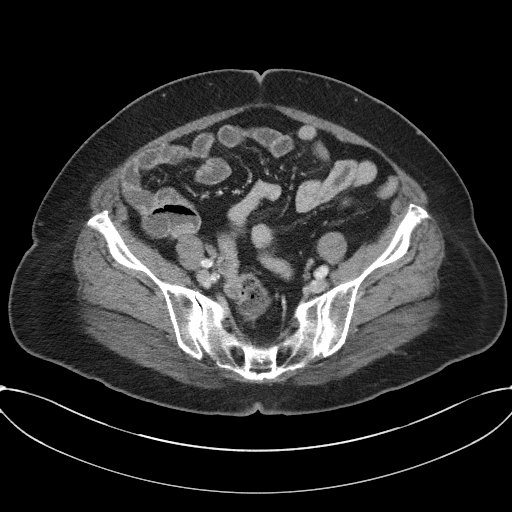
[im 39/92  soft-tissue]
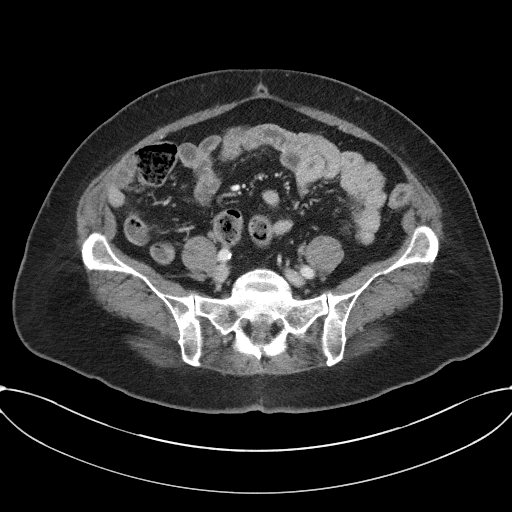
[im 48/92  soft-tissue]
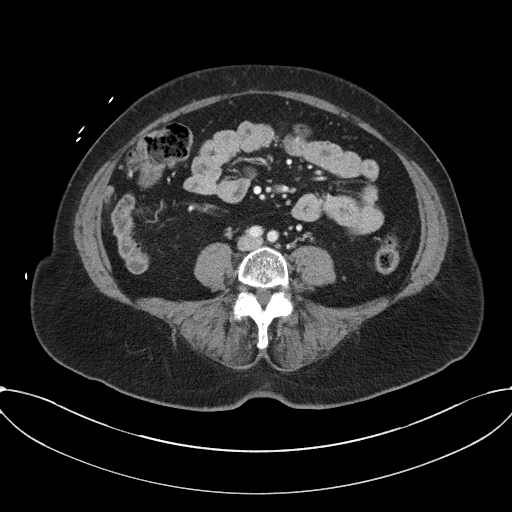
[im 53/92  soft-tissue]
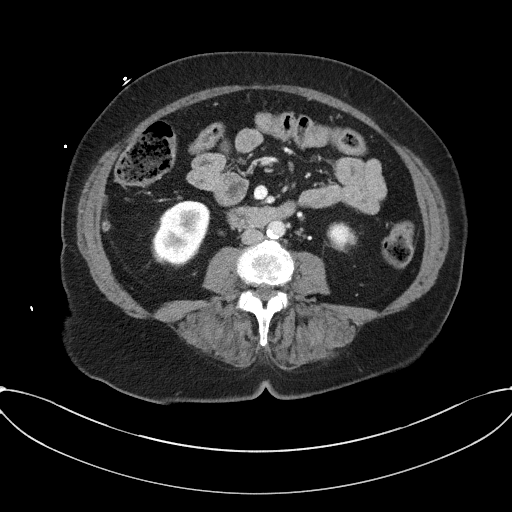
[im 58/92  soft-tissue]
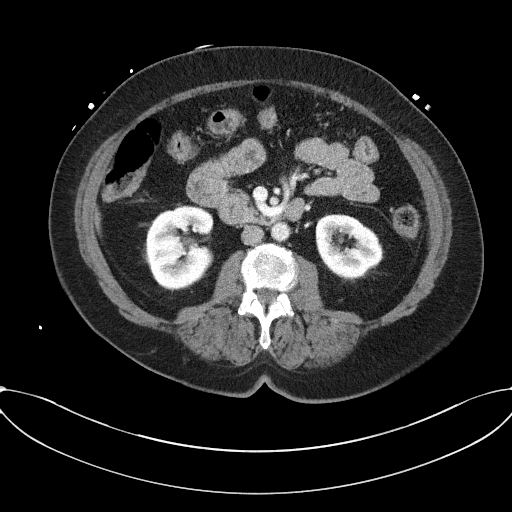
[im 58/92  bone]
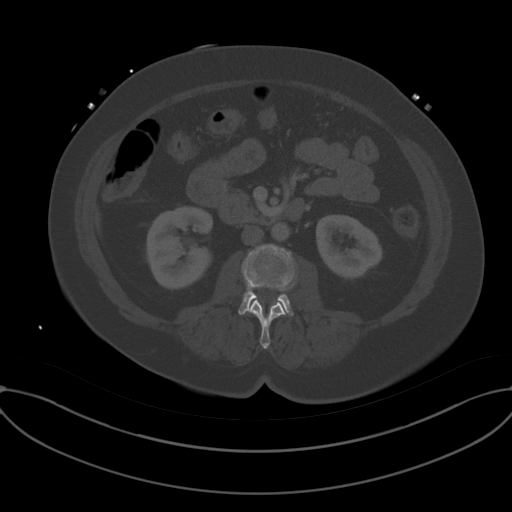
[im 68/92  soft-tissue]
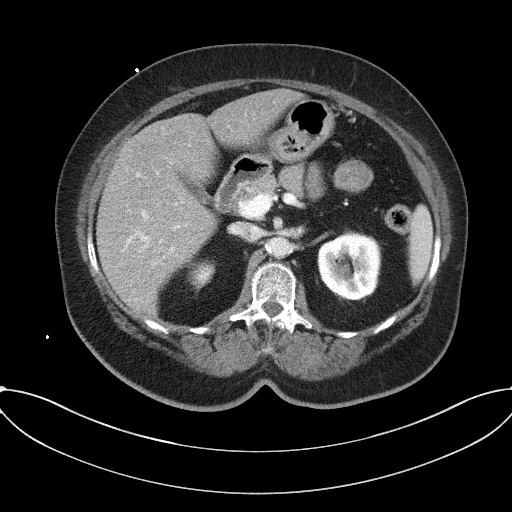
[im 72/92  soft-tissue]
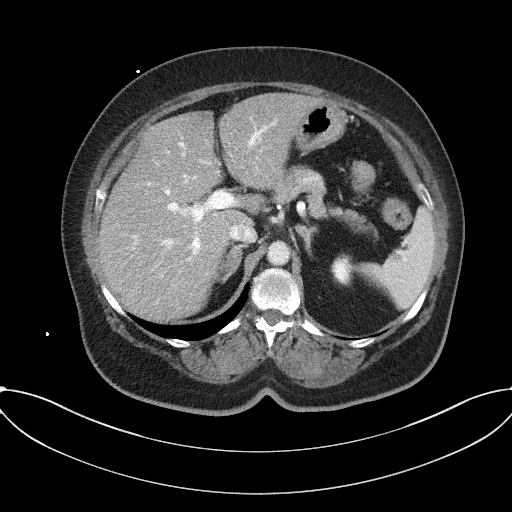
[im 77/92  soft-tissue]
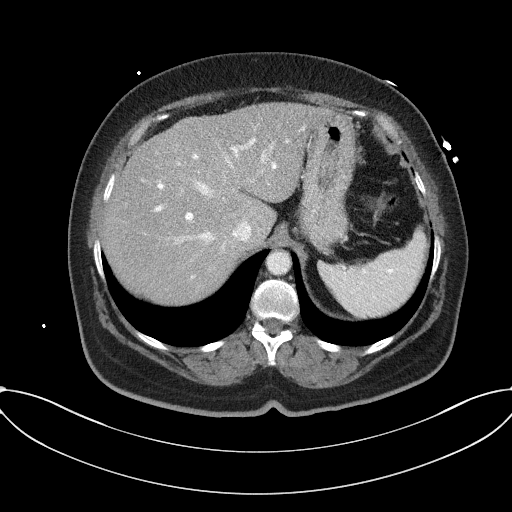
[im 87/92  soft-tissue]
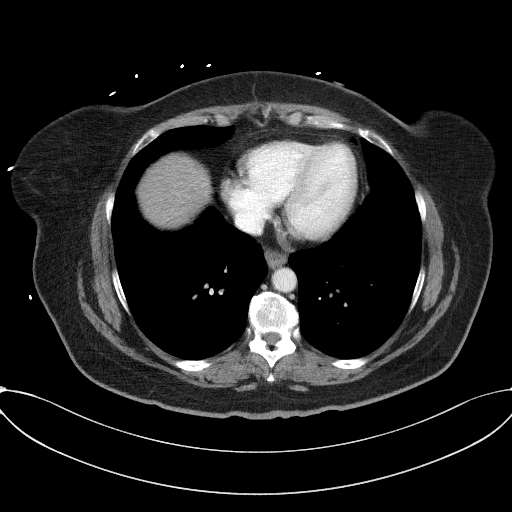

[Series 5: coronal st · coronal · 0.81mm/px · 3 of 104 slices shown]
[im 35/104  soft-tissue]
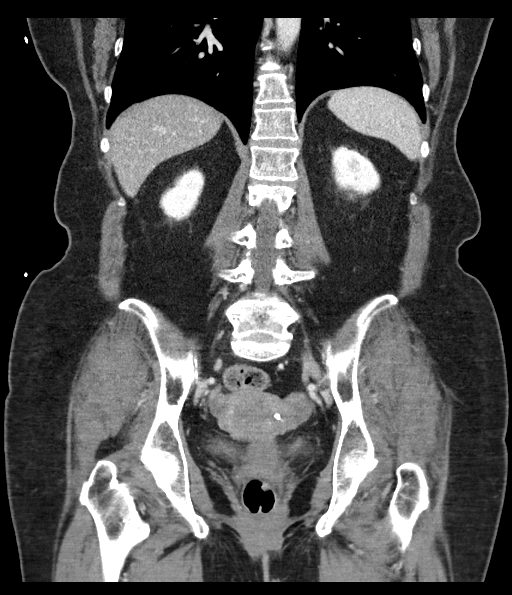
[im 46/104  soft-tissue]
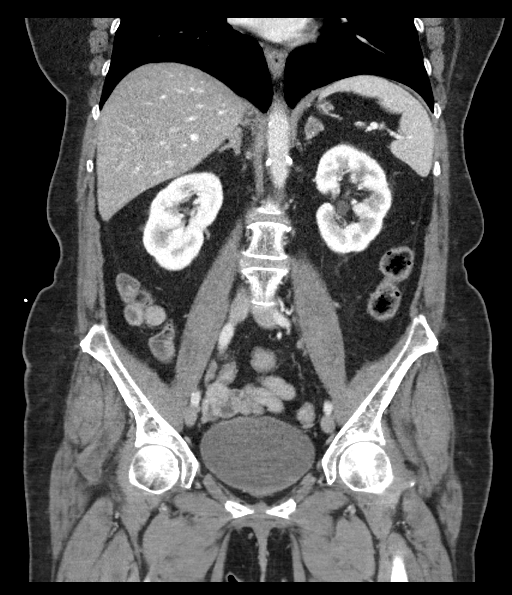
[im 58/104  soft-tissue]
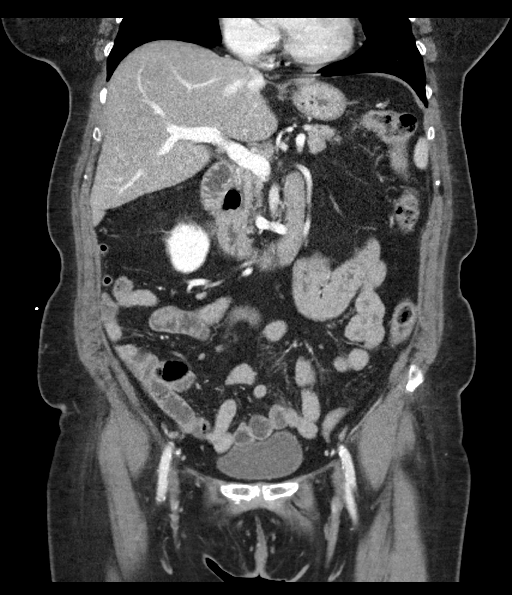

[16 of 46 positions shown; findings below may reference images not displayed]

FINDINGS: Lower chest: No acute abnormality.

Hepatobiliary: There is diffuse fatty infiltration of the liver
parenchyma. No focal liver abnormality is seen. No gallstones,
gallbladder wall thickening, or biliary dilatation.

Pancreas: Unremarkable. No pancreatic ductal dilatation or
surrounding inflammatory changes.

Spleen: Normal in size without focal abnormality.

Adrenals/Urinary Tract: A 1.1 cm low-attenuation right adrenal mass
is seen. A 1.3 cm low-attenuation left adrenal mass is also noted.
Kidneys are normal, without renal calculi, focal lesion, or
hydronephrosis. Bladder is unremarkable.

Stomach/Bowel: Stomach is within normal limits. Appendix appears
normal. No evidence of bowel wall thickening, distention, or
inflammatory changes.

Vascular/Lymphatic: Aortic atherosclerosis. No enlarged abdominal or
pelvic lymph nodes.

Reproductive: A 3.6 cm x 3.0 cm soft tissue mass is seen within the
uterus. An adjacent parenchymal uterine calcification is noted. The
bilateral adnexa are unremarkable.

Other: No abdominal wall hernia or abnormality. No abdominopelvic
ascites.

Musculoskeletal: No acute or significant osseous findings.
IMPRESSION: 1. Hepatic steatosis.
2. Stable bilateral low-attenuation adrenal masses, likely
representing adrenal adenomas.
3. Uterine fibroid.
4. Aortic atherosclerosis.

Aortic Atherosclerosis ([id number]).

## 2023-02-14 ENCOUNTER — Other Ambulatory Visit: Payer: Self-pay | Admitting: Cardiovascular Disease

## 2023-02-21 ENCOUNTER — Emergency Department (HOSPITAL_COMMUNITY): Payer: Medicare HMO

## 2023-02-21 ENCOUNTER — Emergency Department (HOSPITAL_COMMUNITY)
Admission: EM | Admit: 2023-02-21 | Discharge: 2023-02-21 | Disposition: A | Discharge status: Home / Self Care | Payer: Medicare HMO | Attending: Emergency Medicine | Admitting: Emergency Medicine

## 2023-02-21 ENCOUNTER — Other Ambulatory Visit: Payer: Self-pay

## 2023-02-21 DIAGNOSIS — Z7984 Long term (current) use of oral hypoglycemic drugs: Secondary | ICD-10-CM | POA: Insufficient documentation

## 2023-02-21 DIAGNOSIS — Z7902 Long term (current) use of antithrombotics/antiplatelets: Secondary | ICD-10-CM | POA: Insufficient documentation

## 2023-02-21 DIAGNOSIS — Z794 Long term (current) use of insulin: Secondary | ICD-10-CM | POA: Insufficient documentation

## 2023-02-21 DIAGNOSIS — R1031 Right lower quadrant pain: Secondary | ICD-10-CM | POA: Diagnosis present

## 2023-02-21 DIAGNOSIS — Z7982 Long term (current) use of aspirin: Secondary | ICD-10-CM | POA: Diagnosis not present

## 2023-02-21 DIAGNOSIS — E119 Type 2 diabetes mellitus without complications: Secondary | ICD-10-CM | POA: Insufficient documentation

## 2023-02-21 LAB — CBC WITH DIFFERENTIAL/PLATELET
Abs Immature Granulocytes: 0.02 10*3/uL (ref 0.00–0.07)
Basophils Absolute: 0.1 10*3/uL (ref 0.0–0.1)
Basophils Relative: 1 %
Eosinophils Absolute: 0.1 10*3/uL (ref 0.0–0.5)
Eosinophils Relative: 1 %
HCT: 38.7 % (ref 36.0–46.0)
Hemoglobin: 12.4 g/dL (ref 12.0–15.0)
Immature Granulocytes: 0 %
Lymphocytes Relative: 23 %
Lymphs Abs: 1.6 10*3/uL (ref 0.7–4.0)
MCH: 28.7 pg (ref 26.0–34.0)
MCHC: 32 g/dL (ref 30.0–36.0)
MCV: 89.6 fL (ref 80.0–100.0)
Monocytes Absolute: 0.7 10*3/uL (ref 0.1–1.0)
Monocytes Relative: 9 %
Neutro Abs: 4.7 10*3/uL (ref 1.7–7.7)
Neutrophils Relative %: 66 %
Platelets: 213 10*3/uL (ref 150–400)
RBC: 4.32 MIL/uL (ref 3.87–5.11)
RDW: 12.7 % (ref 11.5–15.5)
WBC: 7.1 10*3/uL (ref 4.0–10.5)
nRBC: 0 % (ref 0.0–0.2)

## 2023-02-21 LAB — COMPREHENSIVE METABOLIC PANEL
ALT: 21 U/L (ref 0–44)
AST: 18 U/L (ref 15–41)
Albumin: 3.7 g/dL (ref 3.5–5.0)
Alkaline Phosphatase: 68 U/L (ref 38–126)
Anion gap: 11 (ref 5–15)
BUN: 19 mg/dL (ref 8–23)
CO2: 21 mmol/L — ABNORMAL LOW (ref 22–32)
Calcium: 8.2 mg/dL — ABNORMAL LOW (ref 8.9–10.3)
Chloride: 106 mmol/L (ref 98–111)
Creatinine, Ser: 0.9 mg/dL (ref 0.44–1.00)
GFR, Estimated: >60 mL/min (ref >60)
Glucose, Bld: 143 mg/dL — ABNORMAL HIGH (ref 70–99)
Potassium: 3.8 mmol/L (ref 3.5–5.1)
Sodium: 138 mmol/L (ref 135–145)
Total Bilirubin: 0.5 mg/dL (ref 0.3–1.2)
Total Protein: 6 g/dL — ABNORMAL LOW (ref 6.5–8.1)

## 2023-02-21 LAB — I-STAT CHEM 8, ED
BUN: 19 mg/dL (ref 8–23)
Calcium, Ion: 0.95 mmol/L — ABNORMAL LOW (ref 1.15–1.40)
Chloride: 106 mmol/L (ref 98–111)
Creatinine, Ser: 0.9 mg/dL (ref 0.44–1.00)
Glucose, Bld: 141 mg/dL — ABNORMAL HIGH (ref 70–99)
HCT: 34 % — ABNORMAL LOW (ref 36.0–46.0)
Hemoglobin: 11.6 g/dL — ABNORMAL LOW (ref 12.0–15.0)
Potassium: 3.8 mmol/L (ref 3.5–5.1)
Sodium: 141 mmol/L (ref 135–145)
TCO2: 23 mmol/L (ref 22–32)

## 2023-02-21 LAB — URINALYSIS, ROUTINE W REFLEX MICROSCOPIC
Bilirubin Urine: NEGATIVE
Glucose, UA: NEGATIVE mg/dL
Hgb urine dipstick: NEGATIVE
Ketones, ur: NEGATIVE mg/dL
Leukocytes,Ua: NEGATIVE
Nitrite: NEGATIVE
Protein, ur: NEGATIVE mg/dL
Specific Gravity, Urine: <1.005 — ABNORMAL LOW (ref 1.005–1.030)
pH: 6 (ref 5.0–8.0)

## 2023-02-21 LAB — LIPASE, BLOOD: Lipase: 62 U/L — ABNORMAL HIGH (ref 11–51)

## 2023-02-21 MED ORDER — IOHEXOL 350 MG/ML SOLN
75.0000 mL | Freq: Once | INTRAVENOUS | Status: AC | PRN
  Administered 2023-02-21: 75 mL via INTRAVENOUS

## 2023-02-21 MED ORDER — NAPROXEN 500 MG PO TABS: 500.0000 mg | ORAL_TABLET | Freq: Two times a day (BID) | ORAL | 0 refills | Status: DC

## 2023-02-21 NOTE — ED Triage Notes (Signed)
Pt. Stated, Im having pain in the right side back area that radiates to the front. This started 4 days ago. Some loss of appetite.

## 2023-02-21 NOTE — ED Provider Notes (Signed)
[place] EMERGENCY DEPARTMENT AT [facility] Provider Note   CSN: [id number] Arrival date & time: 02/21/23  1029     History  Chief Complaint  Patient presents with   Abdominal Pain   Flank Pain    Olivia Garza is a 67 y.o. female.  HPI   This patient is a 34 old female, she has a history of diabetes on insulin, Jardiance and metformin, she also has high blood pressure on metoprolol and takes a statin for her cholesterol.  She does not smoke cigarettes but she does drink alcohol and last night on July 4 she did have a couple of drinks.  She reports that she has had some element of chronic right lower back pain but about 4 days ago she started to notice that she was having radiation of that pain around to the front, to the right mid and lower abdomen.  It does seem to get worse with some movements but it has been rather present and gradually worsening over 4 days.  She denies nausea vomiting, no recent diarrhea in the last couple of days no rectal bleeding, no urinary symptoms, no history of kidney stones or kidney infections.  She denies any prior abdominal surgery  Home Medications Prior to Admission medications   Medication Sig Start Date End Date Taking? Authorizing Provider  naproxen (NAPROSYN) 500 MG tablet Take 1 tablet (500 mg total) by mouth 2 (two) times daily with a meal. 02/21/23  Yes Eber Hong, MD  aspirin EC 81 MG tablet Take 1 tablet (81 mg total) by mouth daily. Swallow whole. 02/21/22   O'NealRonnald Ramp, MD  Ca Phosphate-Cholecalciferol (CALCIUM/VITAMIN D3 GUMMIES PO) Take 3 each by mouth daily.    [provider]  clopidogrel (PLAVIX) 75 MG tablet Take 1 tablet (75 mg total) by mouth daily. 03/21/22   O'NealRonnald Ramp, MD  diclofenac Sodium (VOLTAREN) 1 % GEL Apply 1 Application topically 2 (two) times daily as needed (knee pain).    [provider]  empagliflozin (JARDIANCE) 25 MG TABS tablet Take 25 mg by mouth daily. 04/12/20    [provider]  insulin glargine (LANTUS SOLOSTAR) 100 UNIT/ML Solostar Pen Inject 56 Units into the skin at bedtime.    [provider]  losartan (COZAAR) 25 MG tablet Take 25 mg by mouth every evening. 06/02/20   [provider]  melatonin 5 MG TABS Take 10 mg by mouth at bedtime as needed (sleep).    [provider]  metformin (FORTAMET) 1000 MG (OSM) 24 hr tablet Take 1 tablet (1,000 mg total) by mouth 2 (two) times daily. 03/11/22   End, [name], MD  metoprolol tartrate (LOPRESSOR) 25 MG tablet Take 1 tablet by mouth twice daily 02/14/23   O'Neal, Ronnald Ramp, MD  nitroGLYCERIN (NITROSTAT) 0.4 MG SL tablet Place 1 tablet (0.4 mg total) under the tongue every 5 (five) minutes as needed for chest pain. 02/21/22 05/22/22  O'NealRonnald Ramp, MD  rosuvastatin (CRESTOR) 20 MG tablet Take 20 mg by mouth every evening. 06/02/20   [provider]      Allergies    Atorvastatin    Review of Systems   Review of Systems  All other systems reviewed and are negative.   Physical Exam Updated Vital Signs BP 105/85   Pulse 66   Temp 98.1 F (36.7 C) (Oral)   Resp 16   Ht 1.575 m (5\' 2" )   Wt 78 kg   SpO2 96%  BMI 31.46 kg/m  Physical Exam Vitals and nursing note reviewed.  Constitutional:      General: She is not in acute distress.    Appearance: She is well-developed.  HENT:     Head: Normocephalic and atraumatic.     Mouth/Throat:     Pharynx: No oropharyngeal exudate.  Eyes:     General: No scleral icterus.       Right eye: No discharge.        Left eye: No discharge.     Conjunctiva/sclera: Conjunctivae normal.     Pupils: Pupils are equal, round, and reactive to light.  Neck:     Thyroid: No thyromegaly.     Vascular: No JVD.  Cardiovascular:     Rate and Rhythm: Normal rate and regular rhythm.     Heart sounds: Normal heart sounds. No murmur heard.    No friction rub. No gallop.  Pulmonary:     Effort: Pulmonary effort  is normal. No respiratory distress.     Breath sounds: Normal breath sounds. No wheezing or rales.  Abdominal:     General: Bowel sounds are normal. There is no distension.     Palpations: Abdomen is soft. There is no mass.     Tenderness: There is abdominal tenderness.  Musculoskeletal:        General: No tenderness. Normal range of motion.     Cervical back: Normal range of motion and neck supple.  Lymphadenopathy:     Cervical: No cervical adenopathy.  Skin:    General: Skin is warm and dry.     Findings: No erythema or rash.  Neurological:     Mental Status: She is alert.     Coordination: Coordination normal.  Psychiatric:        Behavior: Behavior normal.     ED Results / Procedures / Treatments   Labs (all labs ordered are listed, but only abnormal results are displayed) Labs Reviewed  COMPREHENSIVE METABOLIC PANEL - Abnormal; Notable for the following components:      Result Value   CO2 21 (*)    Glucose, Bld 143 (*)    Calcium 8.2 (*)    Total Protein 6.0 (*)    All other components within normal limits  LIPASE, BLOOD - Abnormal; Notable for the following components:   Lipase 62 (*)    All other components within normal limits  URINALYSIS, ROUTINE W REFLEX MICROSCOPIC - Abnormal; Notable for the following components:   Specific Gravity, Urine <1.005 (*)    All other components within normal limits  I-STAT CHEM 8, ED - Abnormal; Notable for the following components:   Glucose, Bld 141 (*)    Calcium, Ion 0.95 (*)    Hemoglobin 11.6 (*)    HCT 34.0 (*)    All other components within normal limits  CBC WITH DIFFERENTIAL/PLATELET    EKG None  Radiology CT ABDOMEN PELVIS W CONTRAST  Result Date: 02/21/2023 CLINICAL DATA:  Right lower quadrant pain. EXAM: CT ABDOMEN AND PELVIS WITH CONTRAST TECHNIQUE: Multidetector CT imaging of the abdomen and pelvis was performed using the standard protocol following bolus administration of intravenous contrast. RADIATION  DOSE REDUCTION: This exam was performed according to the departmental dose-optimization program which includes automated exposure control, adjustment of the mA and/or kV according to patient size and/or use of iterative reconstruction technique. CONTRAST:  75mL OMNIPAQUE IOHEXOL 350 MG/ML SOLN COMPARISON:  CT 11/02/2020 and older FINDINGS: Lower chest: Lung bases are clear. No pleural effusion.  Stable 2 mm nodule right lower lobe on series 5, image 19 demonstrating over 2 years of stability. No specific imaging follow-up. Similar focus in the middle lobe. Coronary artery calcifications are seen. Hepatobiliary: Fatty liver infiltration. Patent portal vein. Gallbladder is present. Pancreas: Unremarkable. No pancreatic ductal dilatation or surrounding inflammatory changes. Spleen: Normal in size without focal abnormality. Adrenals/Urinary Tract: Stable left adrenal nodule measuring 13 mm on series 3, image 20. Smaller area in the right side is also stable. Favor a benign adenomas. No specific imaging follow-up. Perinephric stranding identified. No enhancing renal mass or collecting system dilatation. Multiple left-sided parapelvic renal cysts. Preserved contours of the urinary bladder. Stomach/Bowel: Moderate colonic stool. Large bowel has a normal course and caliber on this non oral contrast examination. Normal retrocecal appendix. The stomach and small bowel are nondilated. Proximal duodenal diverticula. Vascular/Lymphatic: Aortic atherosclerosis. No enlarged abdominal or pelvic lymph nodes. Reproductive: Heterogeneous uterus consistent with fibroids. Seen previously. No separate adnexal mass no free air or free fluid. Other: No abdominal wall hernia or abnormality. No abdominopelvic ascites. Musculoskeletal: No acute or significant osseous findings. IMPRESSION: No bowel obstruction, free air or free fluid. Normal appendix. Moderate stool. Fatty liver infiltration. Uterine fibroids. Electronically Signed   By: Karen Kays M.D.   On: 02/21/2023 12:19    Procedures Procedures    Medications Ordered in ED Medications  iohexol (OMNIPAQUE) 350 MG/ML injection 75 mL (75 mLs Intravenous Contrast Given 02/21/23 1151)    ED Course/ Medical Decision Making/ A&P                             Medical Decision Making Amount and/or Complexity of Data Reviewed Labs: ordered. Radiology: ordered.  Risk Prescription drug management.    This patient presents to the ED for concern of lower abdominal pain on the right, this involves an extensive number of treatment options, and is a complaint that carries with it a high risk of complications and morbidity.  The differential diagnosis includes appendicitis, gallstones, kidney stones, terminal ileitis, ovarian cyst   Co morbidities that complicate the patient evaluation  Diabetes, obesity   Additional history obtained:  Additional history obtained from electronic medical record External records from outside source obtained and reviewed including CT scan of the abdomen and pelvis was performed approximately 2 years ago in March 2022 when she had had right lower quadrant pain for a week.  She was noted to have a fatty liver, chronic adrenal masses and a uterine fibroid.   Lab Tests:  I Ordered, and personally interpreted labs.  The pertinent results include:  no leukocytosis.   Imaging Studies ordered:  I ordered imaging studies including neg CT bellly  I independently visualized and interpreted imaging which showed no acute findings I agree with the radiologist interpretation   Cardiac Monitoring: / EKG:  The patient was maintained on a cardiac monitor.  I personally viewed and interpreted the cardiac monitored which showed an underlying rhythm of: Normal sinus rhythm    Problem List / ED Course / Critical interventions / Medication management  Patient with abdominal pain, normal vitals, labs unremarkable and a CT scan which is reassuring  I  have reviewed the patients home medicines and have made adjustments as needed   Social Determinants of Health:  None   Test / Admission - Considered:  Considered admission but the patient is well-appearing without surgical findings, stable for discharge  I have discussed with the  patient at the bedside the results, and the meaning of these results.  They have expressed her understanding to the need for follow-up with primary care physician         Final Clinical Impression(s) / ED Diagnoses Final diagnoses:  Right lower quadrant abdominal pain    Rx / DC Orders ED Discharge Orders          Ordered    naproxen (NAPROSYN) 500 MG tablet  2 times daily with meals        02/21/23 1224              Eber Hong, MD 02/21/23 1303

## 2023-02-21 NOTE — Discharge Instructions (Addendum)
Thank you for allowing [place] to treat you in the emergency department today.  After reviewing your examination and potential testing that was done it appears that you are safe to go home.  I would like for you to follow-up with your doctor within the next several days, have them obtain your results and follow-up with them to review all of these tests.  If you should develop severe or worsening symptoms return to the emergency department immediately  Thankfully the CT scan and blood work today was unremarkable and shows no signs of pathology or surgical causes of your pain including no kidney stone no gallbladder problems no appendix problems

## 2023-03-11 ENCOUNTER — Other Ambulatory Visit: Payer: Self-pay | Admitting: Cardiovascular Disease

## 2023-03-28 ENCOUNTER — Other Ambulatory Visit: Payer: Self-pay | Admitting: Internal Medicine

## 2023-04-21 ENCOUNTER — Other Ambulatory Visit: Payer: Self-pay | Admitting: Cardiovascular Disease

## 2023-05-25 ENCOUNTER — Other Ambulatory Visit: Payer: Self-pay | Admitting: Cardiovascular Disease

## 2023-10-06 ENCOUNTER — Other Ambulatory Visit: Payer: Self-pay | Admitting: Cardiovascular Disease

## 2023-10-13 ENCOUNTER — Other Ambulatory Visit: Payer: Self-pay | Admitting: Cardiovascular Disease

## 2023-10-20 ENCOUNTER — Other Ambulatory Visit: Payer: Self-pay | Admitting: Cardiovascular Disease

## 2023-12-02 ENCOUNTER — Other Ambulatory Visit: Payer: Self-pay | Admitting: Cardiovascular Disease

## 2023-12-19 ENCOUNTER — Other Ambulatory Visit: Payer: Self-pay | Admitting: Cardiovascular Disease

## 2024-01-28 NOTE — Progress Notes (Signed)
 Cardiology Office Note:    Date:  02/02/2024   ID:  Olivia Garza, DOB February 09, 1956, MRN [id number]  PCP:  Jeanene Milder, FNP   [facility] Providers Cardiologist:  Oneil Bigness, MD Cardiology APP:  Lamond Pilot, PA     Referring MD: Jeanene Milder, FNP   Chief Complaint  Patient presents with   Follow-up    dizziness    History of Present Illness:    Olivia Garza is a 68 y.o. female with a hx of CAD s/p PCI, hypertension, hyperlipidemia, DM 2 with previous A1c 9.3%.  She underwent PCI/DES-OM branch with 2.0 x 15 mm stent 02/2022 with remaining 50% proximal LAD, 55% distal RCA.  Echocardiogram 03/12/2022 with normal BiV function and no significant valvular disease.  She presents for general cardiology follow up. She has not been seen recently. She presents today with dizziness. Dizziness occurs when she is already standing, does not sound orthostatic. She describes an episode of vertigo at work three weeks ago.  She denies palpitations. Last A1c was 7.5%. she denies chest pain, but sometimes has bilateral arm pain - not with exertion.   She just stopped working last Friday and her company was shut down, she worked as Haematologist.    Past Medical History:  Diagnosis Date   Diabetes mellitus without complication (HCC)    Hyperlipidemia    Hypertension     Past Surgical History:  Procedure Laterality Date   CARDIAC CATHETERIZATION     CORONARY STENT INTERVENTION N/A 03/08/2022   Procedure: CORONARY STENT INTERVENTION;  Surgeon: Sammy Crisp, MD;  Location: [facility];  Service: Cardiovascular;  Laterality: N/A;   LEFT HEART CATH AND CORONARY ANGIOGRAPHY N/A 03/08/2022   Procedure: LEFT HEART CATH AND CORONARY ANGIOGRAPHY;  Surgeon: Sammy Crisp, MD;  Location: [facility];  Service: Cardiovascular;  Laterality: N/A;    Current Medications: Current Meds  Medication Sig   insulin glargine (LANTUS SOLOSTAR) 100  UNIT/ML Solostar Pen Inject 56 Units into the skin at bedtime. (Patient taking differently: Inject 28 Units into the skin at bedtime.)     Allergies:   Atorvastatin   Social History   Socioeconomic History   Marital status: Married    Spouse name: Not on file   Number of children: 4   Years of education: Not on file   Highest education level: Not on file  Occupational History   Occupation: Book keeping  Tobacco Use   Smoking status: Former   Smokeless tobacco: Never  Vaping Use   Vaping status: Never Used  Substance and Sexual Activity   Alcohol use: Yes    Alcohol/week: 3.0 standard drinks of alcohol    Types: 3 Standard drinks or equivalent per week   Drug use: Never   Sexual activity: Not on file  Other Topics Concern   Not on file  Social History Narrative   Not on file   Social Drivers of Health   Financial Resource Strain: Not on file  Food Insecurity: Low Risk  (11/17/2023)   Received from [facility]   Hunger Vital Sign    Within the past 12 months, you worried that your food would run out before you got money to buy more: Never true    Within the past 12 months, the food you bought just didn't last and you didn't have money to get more. : Never true  Transportation Needs: No Transportation Needs (11/17/2023)   Received from [facility]  Transportation    In the past 12 months, has lack of reliable transportation kept you from medical appointments, meetings, work or from getting things needed for daily living? : No  Physical Activity: Not on file  Stress: Not on file  Social Connections: Not on file     Family History: The patient's family history includes Cancer in her father and mother; Diabetes in her father; Heart disease in her father.  ROS:   Please see the history of present illness.     All other systems reviewed and are negative.  EKGs/Labs/Other Studies Reviewed:    The following studies were reviewed today:  EKG  Interpretation Date/Time:  Monday February 02 2024 07:58:48 EDT Ventricular Rate:  65 PR Interval:  156 QRS Duration:  72 QT Interval:  438 QTC Calculation: 455 R Axis:   84  Text Interpretation: Normal sinus rhythm Normal ECG When compared with ECG of 08-Mar-2022 08:39, Nonspecific T wave abnormality no longer evident in Lateral leads Confirmed by Marcie Sever ([id number]) on 02/02/2024 8:03:02 AM    Recent Labs: 02/21/2023: ALT 21; BUN 19; Creatinine, Ser 0.90; Hemoglobin 11.6; Platelets 213; Potassium 3.8; Sodium 141  Recent Lipid Panel No results found for: CHOL, TRIG, HDL, CHOLHDL, VLDL, LDLCALC, LDLDIRECT   Risk Assessment/Calculations:                Physical Exam:    VS:  BP 128/60   Pulse 65   Ht 5' 2 (1.575 m)   Wt 153 lb (69.4 kg)   SpO2 95%   BMI 27.98 kg/m     Wt Readings from Last 3 Encounters:  02/02/24 153 lb (69.4 kg)  02/21/23 172 lb (78 kg)  03/21/22 172 lb (78 kg)     GEN:  Well nourished, well developed in no acute distress HEENT: Normal NECK: No JVD; No carotid bruits LYMPHATICS: No lymphadenopathy CARDIAC: RRR, no murmurs, rubs, gallops RESPIRATORY:  Clear to auscultation without rales, wheezing or rhonchi  ABDOMEN: Soft, non-tender, non-distended MUSCULOSKELETAL:  No edema; No deformity  SKIN: Warm and dry NEUROLOGIC:  Alert and oriented x 3 PSYCHIATRIC:  Normal affect   ASSESSMENT:    1. Coronary artery disease involving native coronary artery of native heart without angina pectoris   2. Abnormal findings diagnostic imaging of heart and coronary circulation   3. Mixed hyperlipidemia   4. Coronary artery disease involving native coronary artery of native heart with unstable angina pectoris (HCC)   5. S/P percutaneous transluminal angioplasty (PTA) with stent placement   6. Dizziness   7. Type 2 diabetes mellitus without complication, without long-term current use of insulin (HCC)    PLAN:    In order of problems listed  above:  Dizziness - unclear etiology but hasn't happened in a couple of weeks - no carotid bruit, no chest pain, no murmur on exam - does not sound orthostatic - will monitor for now, but can place zio monitor if she has more symptoms - will see her back in 1 month   CAD DES-OM - remains on ASA monotherapy - risk factor management for nonobstructive disease in the RCA and LAD - no chest pain - long discussion about starting a walking program to better evaluate exertional symptoms - refilled NTG   Hyperlipidemia with LDL goal < 55 Consider lower LDL goal given DM - last lipid panel 05/2023 with trig 175 and LDL 50 - continue 20 mg crestor   Hypertension - continue 25 mg losartan and 25 mg  lopressor  BID - BP well controlled, continue these   DM - A1c now improved to 7.5% - agree with 25 mg jardiance, also on insulin and metformin    Follow up in 4 weeks to evaluate dizziness.            Medication Adjustments/Labs and Tests Ordered: Current medicines are reviewed at length with the patient today.  Concerns regarding medicines are outlined above.  Orders Placed This Encounter  Procedures   EKG 12-Lead   No orders of the defined types were placed in this encounter.   There are no Patient Instructions on file for this visit.   Signed, Lamond Pilot, [place]  02/02/2024 8:29 AM    [facility]

## 2024-02-02 ENCOUNTER — Ambulatory Visit: Attending: Physician Assistant | Admitting: Physician Assistant

## 2024-02-02 ENCOUNTER — Encounter: Payer: Self-pay | Admitting: Physician Assistant

## 2024-02-02 VITALS — BP 128/60 | HR 65 | Ht 62.0 in | Wt 153.0 lb

## 2024-02-02 DIAGNOSIS — E782 Mixed hyperlipidemia: Secondary | ICD-10-CM | POA: Diagnosis not present

## 2024-02-02 DIAGNOSIS — R42 Dizziness and giddiness: Secondary | ICD-10-CM

## 2024-02-02 DIAGNOSIS — E119 Type 2 diabetes mellitus without complications: Secondary | ICD-10-CM

## 2024-02-02 DIAGNOSIS — I25110 Atherosclerotic heart disease of native coronary artery with unstable angina pectoris: Secondary | ICD-10-CM

## 2024-02-02 DIAGNOSIS — I2510 Atherosclerotic heart disease of native coronary artery without angina pectoris: Secondary | ICD-10-CM | POA: Diagnosis not present

## 2024-02-02 DIAGNOSIS — R931 Abnormal findings on diagnostic imaging of heart and coronary circulation: Secondary | ICD-10-CM

## 2024-02-02 DIAGNOSIS — Z95820 Peripheral vascular angioplasty status with implants and grafts: Secondary | ICD-10-CM

## 2024-02-02 MED ORDER — NITROGLYCERIN 0.4 MG SL SUBL: 0.4000 mg | SUBLINGUAL_TABLET | SUBLINGUAL | 3 refills | Status: AC | PRN

## 2024-02-02 NOTE — Patient Instructions (Signed)
 Medication Instructions:  Your refill has been sent to your pharmacy. *If you need a refill on your cardiac medications before your next appointment, please call your pharmacy*   Lab Work: No medication changes were made during today's visit.  If you have labs (blood work) drawn today and your tests are completely normal, you will receive your results only by: MyChart Message (if you have MyChart) OR A paper copy in the mail If you have any lab test that is abnormal or we need to change your treatment, we will call you to review the results.   Testing/Procedures: No procedures were ordered during today's visit.    Follow-Up: At [facility], you and your health needs are our priority.  As part of our continuing mission to provide you with exceptional heart care, we have created designated Provider Care Teams.  These Care Teams include your primary Cardiologist (physician) and Advanced Practice Providers (APPs -  Physician Assistants and Nurse Practitioners) who all work together to provide you with the care you need, when you need it.  We recommend signing up for the patient portal called MyChart.  Sign up information is provided on this After Visit Summary.  MyChart is used to connect with patients for Virtual Visits (Telemedicine).  Patients are able to view lab/test results, encounter notes, upcoming appointments, etc.  Non-urgent messages can be sent to your provider as well.   To learn more about what you can do with MyChart, go to [email or web address].    Your next appointment:   1 month(s)  Provider:   Marcie Sever, PA-C          Other Instructions Thank you for choosing [facility]!

## 2024-02-25 NOTE — Progress Notes (Signed)
 Cardiology Office Note:    Date:  03/02/2024   ID:  Olivia Garza, DOB 1955-11-21, MRN [id number]  PCP:  Olivia Garza LABOR, FNP   [facility] Providers Cardiologist:  Olivia ONEIDA Decent, MD Cardiology APP:  Olivia Jon Garre, PA     Referring MD: Olivia Garza LABOR, FNP   Chief Complaint  Patient presents with   Dizziness    History of Present Illness:    Olivia Garza is a 68 y.o. female with a hx of CAD s/p PCI, hypertension, hyperlipidemia, DM 2 with previous A1c 9.3%.  She underwent PCI/DES-OM branch with 2.0 x 15 mm stent 02/2022 with remaining 50% proximal LAD, 55% distal RCA.  Echocardiogram 03/12/2022 with normal BiV function and no significant valvular disease.  I saw her in June for general cardiology follow up and she described dizziness felt not consistent with orthostasis.   She presents for follow up. No further dizziness. She is concerned about low normal BP but she is asymptomatic.     Past Medical History:  Diagnosis Date   Diabetes mellitus without complication (HCC)    Hyperlipidemia    Hypertension     Past Surgical History:  Procedure Laterality Date   CARDIAC CATHETERIZATION     CORONARY STENT INTERVENTION N/A 03/08/2022   Procedure: CORONARY STENT INTERVENTION;  Surgeon: Mady Bruckner, MD;  Location: [facility];  Service: Cardiovascular;  Laterality: N/A;   LEFT HEART CATH AND CORONARY ANGIOGRAPHY N/A 03/08/2022   Procedure: LEFT HEART CATH AND CORONARY ANGIOGRAPHY;  Surgeon: Mady Bruckner, MD;  Location: [facility];  Service: Cardiovascular;  Laterality: N/A;    Current Medications: Current Meds  Medication Sig   aspirin  EC 81 MG tablet Take 1 tablet (81 mg total) by mouth daily. Swallow whole.   Ca Phosphate-Cholecalciferol (CALCIUM/VITAMIN D3 GUMMIES PO) Take 3 each by mouth daily. (Patient taking differently: Take 3 each by mouth as needed.)   empagliflozin (JARDIANCE) 25 MG TABS tablet Take 25 mg by mouth daily.    insulin glargine (LANTUS) 100 UNIT/ML Solostar Pen Inject 28 Units into the skin daily.   losartan (COZAAR) 25 MG tablet Take 25 mg by mouth every evening.   metformin  (FORTAMET ) 1000 MG (OSM) 24 hr tablet Take 1 tablet (1,000 mg total) by mouth 2 (two) times daily.   metoprolol  tartrate (LOPRESSOR ) 25 MG tablet Take 1 tablet by mouth twice daily   nitroGLYCERIN  (NITROSTAT ) 0.4 MG SL tablet Place 1 tablet (0.4 mg total) under the tongue every 5 (five) minutes as needed for chest pain.   rosuvastatin (CRESTOR) 20 MG tablet Take 20 mg by mouth every evening.   [DISCONTINUED] insulin glargine (LANTUS SOLOSTAR) 100 UNIT/ML Solostar Pen Inject 56 Units into the skin at bedtime. (Patient taking differently: Inject 28 Units into the skin at bedtime.)     Allergies:   Atorvastatin   Social History   Socioeconomic History   Marital status: Married    Spouse name: Not on file   Number of children: 4   Years of education: Not on file   Highest education level: Not on file  Occupational History   Occupation: Book keeping  Tobacco Use   Smoking status: Former   Smokeless tobacco: Never  Vaping Use   Vaping status: Never Used  Substance and Sexual Activity   Alcohol use: Yes    Alcohol/week: 3.0 standard drinks of alcohol    Types: 3 Standard drinks or equivalent per week   Drug use: Never  Sexual activity: Not on file  Other Topics Concern   Not on file  Social History Narrative   Not on file   Social Drivers of Health   Financial Resource Strain: Not on file  Food Insecurity: Low Risk  (11/17/2023)   Received from [facility]   Hunger Vital Sign    Within the past 12 months, you worried that your food would run out before you got money to buy more: Never true    Within the past 12 months, the food you bought just didn't last and you didn't have money to get more. : Never true  Transportation Needs: No Transportation Needs (11/17/2023)   Received from [facility]    In the past 12 months, has lack of reliable transportation kept you from medical appointments, meetings, work or from getting things needed for daily living? : No  Physical Activity: Not on file  Stress: Not on file  Social Connections: Not on file     Family History: The patient's family history includes Cancer in her father and mother; Diabetes in her father; Heart disease in her father.  ROS:   Please see the history of present illness.     All other systems reviewed and are negative.  EKGs/Labs/Other Studies Reviewed:    The following studies were reviewed today:       Recent Labs: No results found for requested labs within last 365 days.  Recent Lipid Panel No results found for: CHOL, TRIG, HDL, CHOLHDL, VLDL, LDLCALC, LDLDIRECT   Risk Assessment/Calculations:                Physical Exam:    VS:  BP (!) 100/50   Pulse 67   Ht 5' 2 (1.575 m)   Wt 148 lb 12.8 oz (67.5 kg)   SpO2 97%   BMI 27.22 kg/m     Wt Readings from Last 3 Encounters:  03/02/24 148 lb 12.8 oz (67.5 kg)  02/02/24 153 lb (69.4 kg)  02/21/23 172 lb (78 kg)     GEN:  Well nourished, well developed in no acute distress HEENT: Normal NECK: No JVD; No carotid bruits LYMPHATICS: No lymphadenopathy CARDIAC: RRR, no murmurs, rubs, gallops RESPIRATORY:  Clear to auscultation without rales, wheezing or rhonchi  ABDOMEN: Soft, non-tender, non-distended MUSCULOSKELETAL:  No edema; No deformity  SKIN: Warm and dry NEUROLOGIC:  Alert and oriented x 3 PSYCHIATRIC:  Normal affect   ASSESSMENT:    1. CAD S/P percutaneous coronary angioplasty   2. Primary hypertension   3. Hyperlipidemia with target LDL less than 70   4. Type 2 diabetes mellitus without complication, without long-term current use of insulin (HCC)    PLAN:    In order of problems listed above:  CAD DES-OM - remains on ASA monotherapy - risk factor management for nonobstructive disease in  the RCA and LAD - no chest pain   Hyperlipidemia with LDL goal < 55 Consider lower LDL goal given DM - last lipid panel 05/2023 with trig 175 and LDL 50 - continue 20 mg crestor   Hypertension - continue 25 mg losartan and 25 mg lopressor  BID - BP well controlled   DM - A1c now improved to 7.5% - agree with 25 mg jardiance, also on insulin and metformin    Follow up in 1 year with me or Dr. Barbaraann.             Medication Adjustments/Labs and Tests Ordered: Current medicines are  reviewed at length with the patient today.  Concerns regarding medicines are outlined above.  No orders of the defined types were placed in this encounter.  No orders of the defined types were placed in this encounter.   Patient Instructions  Medication Instructions:  The current medical regimen is effective;  continue present plan and medications.  *If you need a refill on your cardiac medications before your next appointment, please call your pharmacy*  Lab Work: NONE If you have labs (blood work) drawn today and your tests are completely normal, you will receive your results only by: MyChart Message (if you have MyChart) OR A paper copy in the mail If you have any lab test that is abnormal or we need to change your treatment, we will call you to review the results.  Testing/Procedures: NONE  Follow-Up: At [facility], you and your health needs are our priority.  As part of our continuing mission to provide you with exceptional heart care, our providers are all part of one team.  This team includes your primary Cardiologist (physician) and Advanced Practice Providers or APPs (Physician Assistants and Nurse Practitioners) who all work together to provide you with the care you need, when you need it.  Your next appointment:   1 Year  Provider:   Darryle ONEIDA Decent, MD          Signed, Jon Garza [place], [place]  03/02/2024 9:26 AM    [facility]

## 2024-02-26 ENCOUNTER — Other Ambulatory Visit: Payer: Self-pay | Admitting: Cardiovascular Disease

## 2024-03-02 ENCOUNTER — Encounter: Payer: Self-pay | Admitting: Physician Assistant

## 2024-03-02 ENCOUNTER — Ambulatory Visit: Attending: Physician Assistant | Admitting: Physician Assistant

## 2024-03-02 VITALS — BP 100/50 | HR 67 | Ht 62.0 in | Wt 148.8 lb

## 2024-03-02 DIAGNOSIS — E785 Hyperlipidemia, unspecified: Secondary | ICD-10-CM

## 2024-03-02 DIAGNOSIS — I2510 Atherosclerotic heart disease of native coronary artery without angina pectoris: Secondary | ICD-10-CM | POA: Diagnosis not present

## 2024-03-02 DIAGNOSIS — I10 Essential (primary) hypertension: Secondary | ICD-10-CM

## 2024-03-02 DIAGNOSIS — Z9861 Coronary angioplasty status: Secondary | ICD-10-CM | POA: Diagnosis not present

## 2024-03-02 DIAGNOSIS — E119 Type 2 diabetes mellitus without complications: Secondary | ICD-10-CM

## 2024-03-02 NOTE — Patient Instructions (Signed)
 Medication Instructions:  The current medical regimen is effective;  continue present plan and medications.  *If you need a refill on your cardiac medications before your next appointment, please call your pharmacy*  Lab Work: NONE If you have labs (blood work) drawn today and your tests are completely normal, you will receive your results only by: MyChart Message (if you have MyChart) OR A paper copy in the mail If you have any lab test that is abnormal or we need to change your treatment, we will call you to review the results.  Testing/Procedures: NONE  Follow-Up: At [facility], you and your health needs are our priority.  As part of our continuing mission to provide you with exceptional heart care, our providers are all part of one team.  This team includes your primary Cardiologist (physician) and Advanced Practice Providers or APPs (Physician Assistants and Nurse Practitioners) who all work together to provide you with the care you need, when you need it.  Your next appointment:   1 Year  Provider:   Darryle ONEIDA Decent, MD
